# Patient Record
Sex: Male | Born: 1970 | Race: White | Hispanic: No | Marital: Single | Smoking: Never smoker
Health system: Southern US, Community
[De-identification: ages and names within clinical notes are randomized; demographics above are authoritative.]

## PROBLEM LIST (undated history)

## (undated) DIAGNOSIS — I10 Essential (primary) hypertension: Secondary | ICD-10-CM

## (undated) DIAGNOSIS — E119 Type 2 diabetes mellitus without complications: Secondary | ICD-10-CM

## (undated) DIAGNOSIS — R079 Chest pain, unspecified: Secondary | ICD-10-CM

## (undated) DIAGNOSIS — I509 Heart failure, unspecified: Secondary | ICD-10-CM

## (undated) DIAGNOSIS — E785 Hyperlipidemia, unspecified: Secondary | ICD-10-CM

## (undated) DIAGNOSIS — I214 Non-ST elevation (NSTEMI) myocardial infarction: Secondary | ICD-10-CM

## (undated) HISTORY — DX: Type 2 diabetes mellitus without complications: E11.9

## (undated) HISTORY — DX: Non-ST elevation (NSTEMI) myocardial infarction: I21.4

## (undated) HISTORY — DX: Morbid (severe) obesity due to excess calories: E66.01

## (undated) HISTORY — DX: Essential (primary) hypertension: I10

## (undated) HISTORY — DX: Chest pain, unspecified: R07.9

## (undated) HISTORY — DX: Heart failure, unspecified: I50.9

---

## 2015-11-13 DIAGNOSIS — E119 Type 2 diabetes mellitus without complications: Secondary | ICD-10-CM | POA: Diagnosis not present

## 2015-11-13 DIAGNOSIS — I10 Essential (primary) hypertension: Secondary | ICD-10-CM | POA: Diagnosis not present

## 2015-11-13 DIAGNOSIS — Z794 Long term (current) use of insulin: Secondary | ICD-10-CM | POA: Diagnosis not present

## 2015-11-13 DIAGNOSIS — E7800 Pure hypercholesterolemia, unspecified: Secondary | ICD-10-CM | POA: Diagnosis not present

## 2015-11-22 DIAGNOSIS — I10 Essential (primary) hypertension: Secondary | ICD-10-CM | POA: Diagnosis not present

## 2015-11-22 DIAGNOSIS — E119 Type 2 diabetes mellitus without complications: Secondary | ICD-10-CM | POA: Diagnosis not present

## 2015-11-22 DIAGNOSIS — E7800 Pure hypercholesterolemia, unspecified: Secondary | ICD-10-CM | POA: Diagnosis not present

## 2016-06-02 DIAGNOSIS — I10 Essential (primary) hypertension: Secondary | ICD-10-CM | POA: Diagnosis not present

## 2016-06-02 DIAGNOSIS — Z23 Encounter for immunization: Secondary | ICD-10-CM | POA: Diagnosis not present

## 2016-06-02 DIAGNOSIS — E119 Type 2 diabetes mellitus without complications: Secondary | ICD-10-CM | POA: Diagnosis not present

## 2016-06-02 DIAGNOSIS — E782 Mixed hyperlipidemia: Secondary | ICD-10-CM | POA: Diagnosis not present

## 2016-06-05 DIAGNOSIS — E13319 Other specified diabetes mellitus with unspecified diabetic retinopathy without macular edema: Secondary | ICD-10-CM | POA: Diagnosis not present

## 2016-07-01 DIAGNOSIS — H43811 Vitreous degeneration, right eye: Secondary | ICD-10-CM | POA: Diagnosis not present

## 2016-07-01 DIAGNOSIS — H3582 Retinal ischemia: Secondary | ICD-10-CM | POA: Diagnosis not present

## 2016-07-01 DIAGNOSIS — E113413 Type 2 diabetes mellitus with severe nonproliferative diabetic retinopathy with macular edema, bilateral: Secondary | ICD-10-CM | POA: Diagnosis not present

## 2016-07-22 DIAGNOSIS — E113412 Type 2 diabetes mellitus with severe nonproliferative diabetic retinopathy with macular edema, left eye: Secondary | ICD-10-CM | POA: Diagnosis not present

## 2016-07-31 DIAGNOSIS — J029 Acute pharyngitis, unspecified: Secondary | ICD-10-CM | POA: Diagnosis not present

## 2016-07-31 DIAGNOSIS — J111 Influenza due to unidentified influenza virus with other respiratory manifestations: Secondary | ICD-10-CM | POA: Diagnosis not present

## 2016-08-05 DIAGNOSIS — E113411 Type 2 diabetes mellitus with severe nonproliferative diabetic retinopathy with macular edema, right eye: Secondary | ICD-10-CM | POA: Diagnosis not present

## 2016-08-31 DIAGNOSIS — E119 Type 2 diabetes mellitus without complications: Secondary | ICD-10-CM | POA: Diagnosis not present

## 2016-08-31 DIAGNOSIS — I10 Essential (primary) hypertension: Secondary | ICD-10-CM | POA: Diagnosis not present

## 2016-08-31 DIAGNOSIS — E782 Mixed hyperlipidemia: Secondary | ICD-10-CM | POA: Diagnosis not present

## 2016-11-04 DIAGNOSIS — E113413 Type 2 diabetes mellitus with severe nonproliferative diabetic retinopathy with macular edema, bilateral: Secondary | ICD-10-CM | POA: Diagnosis not present

## 2016-11-04 DIAGNOSIS — H43811 Vitreous degeneration, right eye: Secondary | ICD-10-CM | POA: Diagnosis not present

## 2016-12-01 DIAGNOSIS — E1129 Type 2 diabetes mellitus with other diabetic kidney complication: Secondary | ICD-10-CM | POA: Diagnosis not present

## 2016-12-01 DIAGNOSIS — R809 Proteinuria, unspecified: Secondary | ICD-10-CM | POA: Diagnosis not present

## 2016-12-01 DIAGNOSIS — I10 Essential (primary) hypertension: Secondary | ICD-10-CM | POA: Diagnosis not present

## 2016-12-01 DIAGNOSIS — E782 Mixed hyperlipidemia: Secondary | ICD-10-CM | POA: Diagnosis not present

## 2016-12-10 DIAGNOSIS — E875 Hyperkalemia: Secondary | ICD-10-CM | POA: Diagnosis not present

## 2017-03-03 DIAGNOSIS — I10 Essential (primary) hypertension: Secondary | ICD-10-CM | POA: Diagnosis not present

## 2017-03-03 DIAGNOSIS — Z23 Encounter for immunization: Secondary | ICD-10-CM | POA: Diagnosis not present

## 2017-03-03 DIAGNOSIS — R809 Proteinuria, unspecified: Secondary | ICD-10-CM | POA: Diagnosis not present

## 2017-03-03 DIAGNOSIS — E1129 Type 2 diabetes mellitus with other diabetic kidney complication: Secondary | ICD-10-CM | POA: Diagnosis not present

## 2017-03-03 DIAGNOSIS — E782 Mixed hyperlipidemia: Secondary | ICD-10-CM | POA: Diagnosis not present

## 2017-03-09 DIAGNOSIS — H43811 Vitreous degeneration, right eye: Secondary | ICD-10-CM | POA: Diagnosis not present

## 2017-03-09 DIAGNOSIS — E113411 Type 2 diabetes mellitus with severe nonproliferative diabetic retinopathy with macular edema, right eye: Secondary | ICD-10-CM | POA: Diagnosis not present

## 2017-03-09 DIAGNOSIS — H3582 Retinal ischemia: Secondary | ICD-10-CM | POA: Diagnosis not present

## 2017-03-09 DIAGNOSIS — E113492 Type 2 diabetes mellitus with severe nonproliferative diabetic retinopathy without macular edema, left eye: Secondary | ICD-10-CM | POA: Diagnosis not present

## 2017-03-20 ENCOUNTER — Encounter (HOSPITAL_COMMUNITY): Payer: Self-pay

## 2017-03-20 ENCOUNTER — Emergency Department (HOSPITAL_COMMUNITY): Payer: BLUE CROSS/BLUE SHIELD

## 2017-03-20 ENCOUNTER — Inpatient Hospital Stay (HOSPITAL_COMMUNITY)
Admission: EM | Admit: 2017-03-20 | Discharge: 2017-03-24 | DRG: 246 | Disposition: A | Discharge status: Home / Self Care | Payer: BLUE CROSS/BLUE SHIELD | Attending: Internal Medicine | Admitting: Internal Medicine

## 2017-03-20 DIAGNOSIS — D649 Anemia, unspecified: Secondary | ICD-10-CM | POA: Diagnosis not present

## 2017-03-20 DIAGNOSIS — I509 Heart failure, unspecified: Secondary | ICD-10-CM | POA: Diagnosis not present

## 2017-03-20 DIAGNOSIS — E118 Type 2 diabetes mellitus with unspecified complications: Secondary | ICD-10-CM

## 2017-03-20 DIAGNOSIS — Z955 Presence of coronary angioplasty implant and graft: Secondary | ICD-10-CM | POA: Diagnosis not present

## 2017-03-20 DIAGNOSIS — I10 Essential (primary) hypertension: Secondary | ICD-10-CM | POA: Diagnosis not present

## 2017-03-20 DIAGNOSIS — J40 Bronchitis, not specified as acute or chronic: Secondary | ICD-10-CM | POA: Diagnosis not present

## 2017-03-20 DIAGNOSIS — E785 Hyperlipidemia, unspecified: Secondary | ICD-10-CM | POA: Diagnosis not present

## 2017-03-20 DIAGNOSIS — I214 Non-ST elevation (NSTEMI) myocardial infarction: Secondary | ICD-10-CM | POA: Diagnosis not present

## 2017-03-20 DIAGNOSIS — I2510 Atherosclerotic heart disease of native coronary artery without angina pectoris: Secondary | ICD-10-CM | POA: Diagnosis present

## 2017-03-20 DIAGNOSIS — R0602 Shortness of breath: Secondary | ICD-10-CM

## 2017-03-20 DIAGNOSIS — Z8659 Personal history of other mental and behavioral disorders: Secondary | ICD-10-CM | POA: Diagnosis not present

## 2017-03-20 DIAGNOSIS — E119 Type 2 diabetes mellitus without complications: Secondary | ICD-10-CM | POA: Diagnosis not present

## 2017-03-20 DIAGNOSIS — Z6836 Body mass index (BMI) 36.0-36.9, adult: Secondary | ICD-10-CM

## 2017-03-20 DIAGNOSIS — I25110 Atherosclerotic heart disease of native coronary artery with unstable angina pectoris: Secondary | ICD-10-CM | POA: Diagnosis not present

## 2017-03-20 DIAGNOSIS — J988 Other specified respiratory disorders: Secondary | ICD-10-CM | POA: Diagnosis not present

## 2017-03-20 DIAGNOSIS — Z87891 Personal history of nicotine dependence: Secondary | ICD-10-CM

## 2017-03-20 DIAGNOSIS — I5031 Acute diastolic (congestive) heart failure: Secondary | ICD-10-CM | POA: Diagnosis not present

## 2017-03-20 DIAGNOSIS — E7800 Pure hypercholesterolemia, unspecified: Secondary | ICD-10-CM | POA: Diagnosis not present

## 2017-03-20 DIAGNOSIS — I110 Hypertensive heart disease with heart failure: Secondary | ICD-10-CM | POA: Diagnosis not present

## 2017-03-20 DIAGNOSIS — Z794 Long term (current) use of insulin: Secondary | ICD-10-CM | POA: Diagnosis not present

## 2017-03-20 DIAGNOSIS — R079 Chest pain, unspecified: Secondary | ICD-10-CM | POA: Diagnosis not present

## 2017-03-20 DIAGNOSIS — R938 Abnormal findings on diagnostic imaging of other specified body structures: Secondary | ICD-10-CM | POA: Diagnosis not present

## 2017-03-20 HISTORY — DX: Hyperlipidemia, unspecified: E78.5

## 2017-03-20 HISTORY — DX: Type 2 diabetes mellitus without complications: E11.9

## 2017-03-20 HISTORY — DX: Chest pain, unspecified: R07.9

## 2017-03-20 HISTORY — DX: Essential (primary) hypertension: I10

## 2017-03-20 LAB — GLUCOSE, CAPILLARY: GLUCOSE-CAPILLARY: 125 mg/dL — AB (ref 65–99)

## 2017-03-20 LAB — I-STAT TROPONIN, ED: Troponin i, poc: 0.16 ng/mL (ref 0.00–0.08)

## 2017-03-20 LAB — HEPARIN LEVEL (UNFRACTIONATED)

## 2017-03-20 LAB — TROPONIN I: Troponin I: 0.34 ng/mL (ref <0.03)

## 2017-03-20 LAB — BASIC METABOLIC PANEL
Anion gap: 7 (ref 5–15)
BUN: 16 mg/dL (ref 6–20)
CHLORIDE: 106 mmol/L (ref 101–111)
CO2: 24 mmol/L (ref 22–32)
CREATININE: 1.03 mg/dL (ref 0.61–1.24)
Calcium: 9.1 mg/dL (ref 8.9–10.3)
GFR calc Af Amer: >60 mL/min (ref >60)
GFR calc non Af Amer: >60 mL/min (ref >60)
GLUCOSE: 141 mg/dL — AB (ref 65–99)
Potassium: 3.8 mmol/L (ref 3.5–5.1)
SODIUM: 137 mmol/L (ref 135–145)

## 2017-03-20 LAB — CBC
HEMATOCRIT: 34.9 % — AB (ref 39.0–52.0)
Hemoglobin: 11.6 g/dL — ABNORMAL LOW (ref 13.0–17.0)
MCH: 30.7 pg (ref 26.0–34.0)
MCHC: 33.2 g/dL (ref 30.0–36.0)
MCV: 92.3 fL (ref 78.0–100.0)
PLATELETS: 304 10*3/uL (ref 150–400)
RBC: 3.78 MIL/uL — ABNORMAL LOW (ref 4.22–5.81)
RDW: 13.3 % (ref 11.5–15.5)
WBC: 14.4 10*3/uL — AB (ref 4.0–10.5)

## 2017-03-20 LAB — BRAIN NATRIURETIC PEPTIDE: B Natriuretic Peptide: 469.2 pg/mL — ABNORMAL HIGH (ref 0.0–100.0)

## 2017-03-20 MED ORDER — FUROSEMIDE 10 MG/ML IJ SOLN
40.0000 mg | Freq: Once | INTRAMUSCULAR | Status: AC
  Administered 2017-03-20: 40 mg via INTRAVENOUS
  Filled 2017-03-20: qty 4

## 2017-03-20 MED ORDER — ASPIRIN 81 MG PO CHEW: 324.0000 mg | CHEWABLE_TABLET | ORAL | Status: DC

## 2017-03-20 MED ORDER — LISINOPRIL 20 MG PO TABS
20.0000 mg | ORAL_TABLET | Freq: Every evening | ORAL | Status: DC
  Administered 2017-03-20 – 2017-03-22 (×3): 20 mg via ORAL
  Filled 2017-03-20 (×3): qty 1

## 2017-03-20 MED ORDER — HEPARIN SODIUM (PORCINE) 5000 UNIT/ML IJ SOLN: 60.0000 [IU]/kg | Freq: Once | INTRAMUSCULAR | Status: DC

## 2017-03-20 MED ORDER — HEPARIN BOLUS VIA INFUSION
2000.0000 [IU] | Freq: Once | INTRAVENOUS | Status: AC
  Administered 2017-03-20: 2000 [IU] via INTRAVENOUS
  Filled 2017-03-20: qty 2000

## 2017-03-20 MED ORDER — NITROGLYCERIN 0.4 MG SL SUBL: 0.4000 mg | SUBLINGUAL_TABLET | SUBLINGUAL | Status: DC | PRN

## 2017-03-20 MED ORDER — INSULIN DEGLUDEC 100 UNIT/ML ~~LOC~~ SOPN: 40.0000 [IU] | PEN_INJECTOR | Freq: Every evening | SUBCUTANEOUS | Status: DC

## 2017-03-20 MED ORDER — LISINOPRIL-HYDROCHLOROTHIAZIDE 20-12.5 MG PO TABS: 1.0000 | ORAL_TABLET | Freq: Every evening | ORAL | Status: DC

## 2017-03-20 MED ORDER — INSULIN GLARGINE 100 UNIT/ML ~~LOC~~ SOLN
40.0000 [IU] | Freq: Every day | SUBCUTANEOUS | Status: DC
  Administered 2017-03-20 – 2017-03-23 (×3): 40 [IU] via SUBCUTANEOUS
  Filled 2017-03-20 (×5): qty 0.4

## 2017-03-20 MED ORDER — ASPIRIN EC 81 MG PO TBEC
81.0000 mg | DELAYED_RELEASE_TABLET | Freq: Every day | ORAL | Status: DC
  Administered 2017-03-21: 81 mg via ORAL
  Filled 2017-03-20: qty 1

## 2017-03-20 MED ORDER — FENOFIBRATE 160 MG PO TABS
160.0000 mg | ORAL_TABLET | Freq: Every day | ORAL | Status: DC
  Administered 2017-03-20: 160 mg via ORAL
  Filled 2017-03-20 (×2): qty 1

## 2017-03-20 MED ORDER — ASPIRIN 81 MG PO CHEW
324.0000 mg | CHEWABLE_TABLET | Freq: Once | ORAL | Status: AC
  Administered 2017-03-20: 324 mg via ORAL
  Filled 2017-03-20: qty 4

## 2017-03-20 MED ORDER — HEPARIN (PORCINE) IN NACL 100-0.45 UNIT/ML-% IJ SOLN: 12.0000 [IU]/kg/h | INTRAMUSCULAR | Status: DC

## 2017-03-20 MED ORDER — INSULIN ASPART 100 UNIT/ML ~~LOC~~ SOLN: 0.0000 [IU] | Freq: Every day | SUBCUTANEOUS | Status: DC

## 2017-03-20 MED ORDER — ASPIRIN 300 MG RE SUPP: 300.0000 mg | RECTAL | Status: DC

## 2017-03-20 MED ORDER — METFORMIN HCL 500 MG PO TABS
1000.0000 mg | ORAL_TABLET | Freq: Two times a day (BID) | ORAL | Status: DC
  Administered 2017-03-21 (×2): 1000 mg via ORAL
  Filled 2017-03-20 (×2): qty 2

## 2017-03-20 MED ORDER — HEPARIN (PORCINE) IN NACL 100-0.45 UNIT/ML-% IJ SOLN
2000.0000 [IU]/h | INTRAMUSCULAR | Status: DC
  Administered 2017-03-20: 1300 [IU]/h via INTRAVENOUS
  Administered 2017-03-21: 1650 [IU]/h via INTRAVENOUS
  Filled 2017-03-20 (×2): qty 250

## 2017-03-20 MED ORDER — ACETAMINOPHEN 325 MG PO TABS
650.0000 mg | ORAL_TABLET | ORAL | Status: DC | PRN
  Administered 2017-03-20: 650 mg via ORAL
  Filled 2017-03-20: qty 2

## 2017-03-20 MED ORDER — POTASSIUM CHLORIDE CRYS ER 20 MEQ PO TBCR
20.0000 meq | EXTENDED_RELEASE_TABLET | Freq: Once | ORAL | Status: AC
  Administered 2017-03-20: 20 meq via ORAL
  Filled 2017-03-20: qty 1

## 2017-03-20 MED ORDER — FUROSEMIDE 10 MG/ML IJ SOLN
40.0000 mg | Freq: Once | INTRAMUSCULAR | Status: AC
Start: 2017-03-20 — End: 2017-03-20
  Administered 2017-03-20: 40 mg via INTRAVENOUS
  Filled 2017-03-20: qty 4

## 2017-03-20 MED ORDER — INSULIN ASPART 100 UNIT/ML ~~LOC~~ SOLN
0.0000 [IU] | Freq: Three times a day (TID) | SUBCUTANEOUS | Status: DC
  Administered 2017-03-21 – 2017-03-22 (×2): 2 [IU] via SUBCUTANEOUS

## 2017-03-20 MED ORDER — ATORVASTATIN CALCIUM 10 MG PO TABS
10.0000 mg | ORAL_TABLET | Freq: Every evening | ORAL | Status: DC
  Administered 2017-03-20 – 2017-03-22 (×3): 10 mg via ORAL
  Filled 2017-03-20 (×3): qty 1

## 2017-03-20 MED ORDER — AMOXICILLIN-POT CLAVULANATE 875-125 MG PO TABS
1.0000 | ORAL_TABLET | Freq: Two times a day (BID) | ORAL | Status: DC
  Administered 2017-03-20 – 2017-03-23 (×6): 1 via ORAL
  Filled 2017-03-20 (×7): qty 1

## 2017-03-20 MED ORDER — HYDROCHLOROTHIAZIDE 12.5 MG PO CAPS
12.5000 mg | ORAL_CAPSULE | Freq: Every evening | ORAL | Status: DC
  Administered 2017-03-20 – 2017-03-21 (×2): 12.5 mg via ORAL
  Filled 2017-03-20 (×3): qty 1

## 2017-03-20 MED ORDER — ONDANSETRON HCL 4 MG/2ML IJ SOLN: 4.0000 mg | Freq: Four times a day (QID) | INTRAMUSCULAR | Status: DC | PRN

## 2017-03-20 MED ORDER — METOPROLOL TARTRATE 25 MG PO TABS
25.0000 mg | ORAL_TABLET | Freq: Once | ORAL | Status: AC
  Administered 2017-03-20: 25 mg via ORAL
  Filled 2017-03-20: qty 1

## 2017-03-20 MED ORDER — HEPARIN BOLUS VIA INFUSION
4000.0000 [IU] | Freq: Once | INTRAVENOUS | Status: AC
  Administered 2017-03-20: 4000 [IU] via INTRAVENOUS
  Filled 2017-03-20: qty 4000

## 2017-03-20 NOTE — Progress Notes (Signed)
ANTICOAGULATION CONSULT NOTE   Pharmacy Consult for Heparin Indication: chest pain/ACS  No Known Allergies  Patient Measurements: TBW 115 kg per RN Heparin Dosing Weight  Vital Signs: Temp: 98.4 F (36.9 C) (09/22 2029) Temp Source: Oral (09/22 2029) BP: 130/72 (09/22 2029) Pulse Rate: 77 (09/22 2029)  Assessment: 46 yo M presents on 9/22 with SOB and possible CHF. I stat troponin elevated. Pharmacy consulted to start heparin. Hgb 11.6, plts wnl.  Initial heparin level is subtherapeutic < 0.10. No issues with infusion or sxs of bleeding.   Goal of Therapy:  Heparin level 0.3-0.7 units/ml Monitor platelets by anticoagulation protocol: Yes   Plan:  1. Heparin 2,000 unit bolus 2. Increase heparin gtt to 1650 units/hr 3. Next heparin level with am labs    Pollyann Samples, PharmD, BCPS 03/20/2017, 9:06 PM

## 2017-03-20 NOTE — Progress Notes (Signed)
ANTICOAGULATION CONSULT NOTE - Initial Consult  Pharmacy Consult for Heparin Indication: chest pain/ACS  No Known Allergies  Patient Measurements: TBW 115 kg per RN Heparin Dosing Weight:   Vital Signs: Temp: 98.3 F (36.8 C) (09/22 1113) Temp Source: Oral (09/22 1113) BP: 115/68 (09/22 1230) Pulse Rate: 79 (09/22 1230)  Assessment: 46 yo M presents on 9/22 with SOB and possible CHF. I stat troponin elevated. Pharmacy consulted to start heparin. Hgb 11.6, plts wnl.  Goal of Therapy:  Heparin level 0.3-0.7 units/ml Monitor platelets by anticoagulation protocol: Yes   Plan:  Give heparin 4,000 unit bolus Start heparin gtt at 1,300 units/hr Check heparin level in 6 hrs Monitor daily heparin level, CBC, s/s of bleed   Enzo Bi, PharmD, [facility] Clinical Pharmacist Pager [phone] 03/20/2017 12:49 PM

## 2017-03-20 NOTE — Progress Notes (Signed)
No new orders. Continue heparin. Trend troponin per MD.  Continue to monitor.  Bettey Mare, RN

## 2017-03-20 NOTE — ED Provider Notes (Addendum)
[facility]-EMERGENCY DEPT Provider Note   CSN: [id number] Arrival date & time: 03/20/17  1104     History   Chief Complaint Chief Complaint  Patient presents with  . Shortness of Breath    HPI George Moreno is a 46 y.o. male.  HPI This is a 46 year old man with a history of type 2 diabetes, hypertension, hypercholesterolemia who has had dyspnea for the past 48 hours. He states that he had a tooth removed on Wednesday and was placed on antibiotics. We will cut Thursday he felt somewhat restless. This has continued. He thought that he was type of respiratory infection. Says cough is nonproductive. He has not noticed any chest pain with the exception of one short episode of right anterior chest discomfort that was in a small area. Not currently occurring. He was seen at the [facility] walk-in clinic today. There he was noted to be dyspneic and chest x-Darol Cush revealed a question of early congestive heart failure. He was diagnosed with early CHF, acute respiratory infection, diabetes and anemia. He was given ceftriaxone 1 g IM. His white blood cell count there was noted to be elevated at 15,100 hemoglobin 11.3 x-Adela Esteban report from their reads changes of early CHF but we are unable to visualize the actual x-Kendrew Paci here. Labs drawn here at triage revealed elevated troponin Dental extraction on Wednesday and states there has been no bleeding or problems with the site which is in the right lower jaw molar. He does feel the stitches in place. Past Medical History:  Diagnosis Date  . Diabetes mellitus without complication (HCC)   . Hypertension     There are no active problems to display for this patient.   History reviewed. No pertinent surgical history.     Home Medications    Prior to Admission medications   Not on File    Family History History reviewed. No pertinent family history.  Social History Social History  Substance Use Topics  . Smoking status: Never Smoker  . Smokeless tobacco: Never  Used  . Alcohol use Yes     Comment: rare     Allergies   Patient has no known allergies.   Review of Systems Review of Systems  Constitutional: Positive for appetite change and fatigue.  HENT: Negative.   Eyes: Negative.   Respiratory: Positive for cough.   Cardiovascular: Positive for chest pain.  Gastrointestinal: Positive for abdominal distention.  Genitourinary: Negative.   Musculoskeletal: Negative.   Skin: Negative.   Allergic/Immunologic: Negative.   Neurological: Negative.   Hematological: Negative.   Psychiatric/Behavioral: Negative.   All other systems reviewed and are negative.    Physical Exam Updated Vital Signs BP 139/83 (BP Location: Left Arm)   Pulse 86   Temp 98.3 F (36.8 C) (Oral)   Resp 18   SpO2 96%   Physical Exam  Constitutional: He is oriented to person, place, and time. He appears well-developed.  HENT:  Head: Normocephalic and atraumatic.  Right Ear: External ear normal.  Left Ear: External ear normal.  Nose: Nose normal.  Healing site of dental extraction right lower jaw with sutures in place  Eyes: EOM are normal.  Neck: Normal range of motion. Neck supple. No tracheal deviation present.  Cardiovascular: Normal rate and regular rhythm.   Pulmonary/Chest: Effort normal. No respiratory distress. He has no wheezes. He has no rales.  Few basilar crackles bilaterally  Abdominal: Soft. Bowel sounds are normal.  Musculoskeletal: Normal range of motion. He exhibits no edema.  Neurological: He  is alert and oriented to person, place, and time.  Skin: Skin is warm and dry.  Psychiatric: He has a normal mood and affect. His behavior is normal.  Nursing note and vitals reviewed.    ED Treatments / Results  Labs (all labs ordered are listed, but only abnormal results are displayed) Labs Reviewed  I-STAT TROPONIN, ED - Abnormal; Notable for the following:       Result Value   Troponin i, poc 0.16 (*)    All other components within  normal limits  BASIC METABOLIC PANEL  CBC    EKG  EKG Interpretation  Date/Time:  Saturday March 20 2017 11:11:04 EDT Ventricular Rate:  86 PR Interval:  134 QRS Duration: 100 QT Interval:  362 QTC Calculation: 433 R Axis:   87 Text Interpretation:  Normal sinus rhythm Septal infarct , age undetermined ST & T wave abnormality, consider inferior ischemia Abnormal ECG Confirmed by Margarita Grizzle [phone]) on 03/20/2017 12:07:37 PM       Radiology No results found.  Procedures .Critical Care Performed by: Margarita Grizzle Authorized by: Margarita Grizzle   Critical care provider statement:    Critical care time (minutes):  60   Critical care start time:  03/20/2017 11:15 AM   Critical care end time:  03/20/2017 2:17 PM   Critical care time was exclusive of:  Separately billable procedures and treating other patients   Critical care was necessary to treat or prevent imminent or life-threatening deterioration of the following conditions:  Cardiac failure   Critical care was time spent personally by me on the following activities:  Development of treatment plan with patient or surrogate, discussions with consultants, ordering and performing treatments and interventions, ordering and review of laboratory studies, ordering and review of radiographic studies, pulse oximetry, re-evaluation of patient's condition, review of old charts, examination of patient and evaluation of patient's response to treatment (Review of record from primary care -patient was treated in [facility] clinic prior to being sent to ED)     (including critical care time)  Medications Ordered in ED Medications - No data to display   Initial Impression / Assessment and Plan / ED Course  I have reviewed the triage vital signs and the nursing notes.  Pertinent labs & imaging results that were available during my care of the patient were reviewed by me and considered in my medical decision making (see chart for details).    patient treated here for CHF and an STEMI with aspirin, heparin, and Lasix. He is hemodynamically stable. I discussed his care with Dr. Dietrich Pates, on call for cardiology. She will see and evaluate the patient for admission.  C  Final Clinical Impressions(s) / ED Diagnoses   Final diagnoses:  NSTEMI (non-ST elevated myocardial infarction) (HCC)  Acute congestive heart failure, unspecified heart failure type [facility])    New Prescriptions New Prescriptions   No medications on file     Margarita Grizzle, MD 03/20/17 1417    Margarita Grizzle, MD 03/20/17 1430

## 2017-03-20 NOTE — Progress Notes (Signed)
Patient troponin level 0.34. Patient denies chest pain 0/10.  Cardilogy MD Means notified.  Awaiting on possible orders.  Will continue to monitor.  Tremeka Helbling, RN

## 2017-03-20 NOTE — ED Triage Notes (Addendum)
[facility] for follow up on SOB and possible CHF on his XR. He reports shortness of breath since Sunday. Resp e/u. Skin warm and dry. Hx of chronic bronchitis. Lung sounds clear.

## 2017-03-20 NOTE — H&P (Addendum)
Primary Physician: Primary Cardiologist:  New    Asked to see by Dr Rosalia Hammers    Pt presents to ED for chest pressure  And SOB   HPI: Pt is a 46 yo with DM Type II, HTN, HL  Complains of SOB for 48 hours    Pt says that he has beeen feeling good until this week  Monday he was very busy lifting things  Up and down stairs without a problem Wed he had a tooth extracted  Given ABX   Went home Thurs and Fri had severe feverish feeling and chills  Did not take temp Coughing nonroductive Rigoring  On Thursday develped R sided CP  Not pleuritic or porsiontal  WOnders if he had pulled a muscle  Went to [facility] walk in clinic this AM   SOB  CXR with early CHF   Given ceftriaxon IM  WBC 15000.  Sent to ED  Currently mild R parasternal chest discomfort  Breathgin imporoved  Feels goo  Pt dx with DM 2 years ago  A1C was 14 at time  Just prior he was diaagnosed with HTN and HL         Past Medical History:  Diagnosis Date  . Diabetes mellitus without complication (HCC)   . Hypertension   hyperlipidemia     (Not in a hospital admission)   . heparin  4,000 Units Intravenous Once    Infusions: . heparin      No Known Allergies  Social History   Social History  . Marital status: Single    Spouse name: N/A  . Number of children: N/A  . Years of education: N/A   Occupational History  . Not on file.   Social History Main Topics  . Smoking status: Never Smoker  . Smokeless tobacco: Never Used  . Alcohol use Yes     Comment: rare  . Drug use: Unknown  . Sexual activity: Not on file   Other Topics Concern  . Not on file   Social History Narrative  . No narrative on file   FHX  Unknown  Pt adopted   History reviewed. No pertinent family history.  REVIEW OF SYSTEMS:  All systems reviewed  Negative to the above problem except as noted above.    PHYSICAL EXAM: Vitals:   03/20/17 1230 03/20/17 1300  BP: 115/68 114/68  Pulse: 79 78  Resp: 17 16  Temp:    SpO2: 93%  96%     Intake/Output Summary (Last 24 hours) at 03/20/17 1340 Last data filed at 03/20/17 1307  Gross per 24 hour  Intake                0 ml  Output              275 ml  Net             -275 ml    General:  Well appearing. No respiratory difficulty HEENT: normal Neck: supple. no JVD. Carotids 2+ bilat; no bruits. No lymphadenopathy or thryomegaly appreciated. Cor: PMI nondisplaced. Regular rate & rhythm. No rubs, gallops or murmurs. Lungs: Rales at bases   Abdomen: soft, nontender, nondistended.  Mild RUQ tenderness  . No bruits or masses. Good bowel sounds. Extremities: no cyanosis, clubbing, rash, edema Neuro: alert & oriented x 3, cranial nerves grossly intact. moves all 4 extremities w/o difficulty. Affect pleasant.  ECG:  SR 86 bpm  ST depression inferior and laterally  Results for orders placed or performed during the hospital encounter of 03/20/17 (from the past 24 hour(s))  Basic metabolic panel     Status: Abnormal   Collection Time: 03/20/17 11:15 AM  Result Value Ref Range   Sodium 137 135 - 145 mmol/L   Potassium 3.8 3.5 - 5.1 mmol/L   Chloride 106 101 - 111 mmol/L   CO2 24 22 - 32 mmol/L   Glucose, Bld 141 (H) 65 - 99 mg/dL   BUN 16 6 - 20 mg/dL   Creatinine, Ser 1.61 0.61 - 1.24 mg/dL   Calcium 9.1 8.9 - 09.6 mg/dL   GFR calc non Af Amer >60 >60 mL/min   GFR calc Af Amer >60 >60 mL/min   Anion gap 7 5 - 15  CBC     Status: Abnormal   Collection Time: 03/20/17 11:15 AM  Result Value Ref Range   WBC 14.4 (H) 4.0 - 10.5 K/uL   RBC 3.78 (L) 4.22 - 5.81 MIL/uL   Hemoglobin 11.6 (L) 13.0 - 17.0 g/dL   HCT 04.5 (L) 40.9 - 81.1 %   MCV 92.3 78.0 - 100.0 fL   MCH 30.7 26.0 - 34.0 pg   MCHC 33.2 30.0 - 36.0 g/dL   RDW 91.4 78.2 - 95.6 %   Platelets 304 150 - 400 K/uL  I-stat troponin, ED     Status: Abnormal   Collection Time: 03/20/17 11:22 AM  Result Value Ref Range   Troponin i, poc 0.16 (HH) 0.00 - 0.08 ng/mL   Comment NOTIFIED PHYSICIAN    Comment 3           Brain natriuretic peptide     Status: Abnormal   Collection Time: 03/20/17 12:46 PM  Result Value Ref Range   B Natriuretic Peptide 469.2 (H) 0.0 - 100.0 pg/mL   Dg Chest Portable 1 View  Result Date: 03/20/2017 CLINICAL DATA:  Shortness of breath and possible CHF. EXAM: PORTABLE CHEST 1 VIEW COMPARISON:  03/20/2017 FINDINGS: AP portable view of the chest was obtained. Slightly low lung volumes. Again noted are prominent central vascular structures. Prominent lung markings at the right lung base. Heart size is within normal limits. Trachea is midline. Negative for a pneumothorax. IMPRESSION: Prominent central vascular structures, particularly in the right lower chest. Findings may be related to low lung volumes and vascular congestion. Electronically Signed   By: Richarda Overlie M.D.   On: 03/20/2017 12:31     ASSESSMENT:   Pt is a 46 yo with no known cardiac history  Presents with SOB and R sided CP for the past few days   Also Fever/chills    On exam:  Lungs with mild rales Labs signif for WBG 14  BNP 469  Trop 0.16 CXR with vascular congestion EKG with diffuse ST depression  Inferolaterally  No old EKG to compare   Pt presentw with volume overload on exam (mild) and Hx of fevers /chills  On ABX now. What cause current presentation is confusiong  Pt says asymtpmatic earlier in week  Timing for viral cardiomyopathy is off.  Does not appear septic     Plan Admit to telemetry cycle troponin  On IV heparin now  If next trop minimally increased then d/c Continue diuresis with IV lasix  Give additional dose of lasix this PM  Strict I/O    Echo to eval LVEF     2.  HTN  Pt is on prinizide at home  Continue  Follow  3  HL  On statin  Check lipids in AM  4  DM  A1c 2 wks ago reported 6.3  Continue home meds  Check CS qac

## 2017-03-21 ENCOUNTER — Inpatient Hospital Stay (HOSPITAL_COMMUNITY): Payer: BLUE CROSS/BLUE SHIELD

## 2017-03-21 LAB — GLUCOSE, CAPILLARY
GLUCOSE-CAPILLARY: 93 mg/dL (ref 65–99)
GLUCOSE-CAPILLARY: 87 mg/dL (ref 65–99)
Glucose-Capillary: 122 mg/dL — ABNORMAL HIGH (ref 65–99)

## 2017-03-21 LAB — LIPID PANEL
CHOLESTEROL: 115 mg/dL (ref 0–200)
HDL: 28 mg/dL — ABNORMAL LOW (ref >40)
LDL Cholesterol: 62 mg/dL (ref 0–99)
TRIGLYCERIDES: 125 mg/dL (ref <150)
Total CHOL/HDL Ratio: 4.1 RATIO
VLDL: 25 mg/dL (ref 0–40)

## 2017-03-21 LAB — CBC WITH DIFFERENTIAL/PLATELET
BASOS PCT: 0 %
EOS PCT: 3 %
HCT: 34.8 % — ABNORMAL LOW (ref 39.0–52.0)
HEMOGLOBIN: 11.9 g/dL — AB (ref 13.0–17.0)
Lymphocytes Relative: 26 %
MCH: 31.5 pg (ref 26.0–34.0)
MCHC: 34.2 g/dL (ref 30.0–36.0)
MCV: 92.1 fL (ref 78.0–100.0)
MONOS PCT: 10 %
NEUTROS PCT: 61 %
PLATELETS: 285 10*3/uL (ref 150–400)
RBC: 3.78 MIL/uL — AB (ref 4.22–5.81)
RDW: 13.1 % (ref 11.5–15.5)
WBC: 10.6 10*3/uL — AB (ref 4.0–10.5)

## 2017-03-21 LAB — HEPARIN LEVEL (UNFRACTIONATED)

## 2017-03-21 LAB — BASIC METABOLIC PANEL
Anion gap: 8 (ref 5–15)
BUN: 18 mg/dL (ref 6–20)
CHLORIDE: 102 mmol/L (ref 101–111)
CO2: 27 mmol/L (ref 22–32)
CREATININE: 1.07 mg/dL (ref 0.61–1.24)
Calcium: 9.3 mg/dL (ref 8.9–10.3)
GFR calc Af Amer: >60 mL/min (ref >60)
GFR calc non Af Amer: >60 mL/min (ref >60)
Glucose, Bld: 110 mg/dL — ABNORMAL HIGH (ref 65–99)
POTASSIUM: 3.7 mmol/L (ref 3.5–5.1)
SODIUM: 137 mmol/L (ref 135–145)

## 2017-03-21 LAB — ECHOCARDIOGRAM COMPLETE
HEIGHTINCHES: 67 in
Weight: 3860.8 oz

## 2017-03-21 LAB — MAGNESIUM: Magnesium: 1.8 mg/dL (ref 1.7–2.4)

## 2017-03-21 LAB — TROPONIN I
TROPONIN I: 0.27 ng/mL — AB (ref <0.03)
TROPONIN I: 0.32 ng/mL — AB (ref <0.03)

## 2017-03-21 LAB — HEMOGLOBIN A1C
Hgb A1c MFr Bld: 6.2 % — ABNORMAL HIGH (ref 4.8–5.6)
Mean Plasma Glucose: 131.24 mg/dL

## 2017-03-21 LAB — HIV ANTIBODY (ROUTINE TESTING W REFLEX): HIV SCREEN 4TH GENERATION: NONREACTIVE

## 2017-03-21 MED ORDER — FENOFIBRATE 160 MG PO TABS
160.0000 mg | ORAL_TABLET | Freq: Every day | ORAL | Status: DC
  Administered 2017-03-21 – 2017-03-23 (×3): 160 mg via ORAL
  Filled 2017-03-21 (×3): qty 1

## 2017-03-21 MED ORDER — HEPARIN BOLUS VIA INFUSION
3000.0000 [IU] | Freq: Once | INTRAVENOUS | Status: AC
  Administered 2017-03-21: 3000 [IU] via INTRAVENOUS
  Filled 2017-03-21: qty 3000

## 2017-03-21 MED ORDER — PERFLUTREN LIPID MICROSPHERE
1.0000 mL | INTRAVENOUS | Status: AC | PRN
  Administered 2017-03-21: 2 mL via INTRAVENOUS
  Filled 2017-03-21: qty 10

## 2017-03-21 NOTE — Progress Notes (Signed)
ANTICOAGULATION CONSULT NOTE - Follow Up Consult  Pharmacy Consult for heparin Indication: chest pain/ACS  Labs:  Recent Labs  03/20/17 1115 03/20/17 1806 03/20/17 2006 03/21/17 0000 03/21/17 0521  HGB 11.6*  --   --   --  11.9*  HCT 34.9*  --   --   --  34.8*  PLT 304  --   --   --  285  HEPARINUNFRC  --   --  <0.10*  --  <0.10*  CREATININE 1.03  --   --   --  1.07  TROPONINI  --  0.34*  --  0.32* 0.27*    Assessment: 46yo male remains undetectable on heparin despite rate increase, no gtt issues reported overnight.  Goal of Therapy:  Heparin level 0.3-0.7 units/ml   Plan:  Will rebolus with heparin 3000 units and increase gtt by 3-4 units/kg/hr to 2000 units/hr and check level in 6hr.  Vernard Gambles, PharmD, BCPS  03/21/2017,7:21 AM

## 2017-03-21 NOTE — Progress Notes (Signed)
Patient  Has blood sugar of 87 tonight with 40 of lantus order. Patient is npo at midnight. Attending notified. lantus will be held.

## 2017-03-21 NOTE — Progress Notes (Signed)
Reviewed echo  LVEF normal with basal inferior hypokinesis   I have discussed with pt  Concerning with this his histroy of DM  and presentation with elevated trop and CHF for CAD  Would evaluate  Discussed CT vs cath  Pt has demanding physical job  Decide to proceed with LHC in am   Risks and benefits described  Pt understands and agrees to proceed.    Dietrich Pates

## 2017-03-21 NOTE — Progress Notes (Signed)
Progress Note  Patient Name: George Moreno Date of Encounter: 03/21/2017  Primary Cardiologist: New  Subjective   No CP  Breatihng is OK    Inpatient Medications    Scheduled Meds: . amoxicillin-clavulanate  1 tablet Oral BID  . aspirin EC  81 mg Oral Daily  . atorvastatin  10 mg Oral QPM  . fenofibrate  160 mg Oral Daily  . heparin  3,000 Units Intravenous Once  . lisinopril  20 mg Oral QPM   And  . hydrochlorothiazide  12.5 mg Oral QPM  . insulin aspart  0-15 Units Subcutaneous TID WC  . insulin aspart  0-5 Units Subcutaneous QHS  . insulin glargine  40 Units Subcutaneous QHS  . metFORMIN  1,000 mg Oral BID WC   Continuous Infusions: . heparin 1,650 Units/hr (03/21/17 0155)   PRN Meds: acetaminophen, nitroGLYCERIN, ondansetron (ZOFRAN) IV   Vital Signs    Vitals:   03/20/17 2029 03/21/17 0016 03/21/17 0630 03/21/17 0727  BP: 130/72 130/70 98/62 136/72  Pulse: 77 80 94 68  Resp: Temp: 98.4 F (36.9 C) 98.5 F (36.9 C) 98.9 F (37.2 C) 97.7 F (36.5 C)  TempSrc: Oral Oral Oral Oral  SpO2: 97% 96% 94% 96%  Weight:   241 lb 4.8 oz (109.5 kg)   Height:        Intake/Output Summary (Last 24 hours) at 03/21/17 0754 Last data filed at 03/21/17 1610  Gross per 24 hour  Intake           491.54 ml  Output             2650 ml  Net         -2158.46 ml   Filed Weights   03/20/17 1316 03/20/17 1757 03/21/17 0630  Weight: 255 lb (115.7 kg) 243 lb 1.6 oz (110.3 kg) 241 lb 4.8 oz (109.5 kg)    Telemetry    SR   - Personally Reviewed  ECG      Physical Exam   GEN: No acute distress.   Neck: No JVD Cardiac: RRR, no murmurs, rubs, or gallops.  Respiratory: Clear to auscultation bilaterally. GI: Soft, nontender, non-distended  MS: No edema; No deformity. Neuro:  Nonfocal  Psych: Normal affect   Labs    Chemistry Recent Labs Lab 03/20/17 1115 03/21/17 0521  NA 137 137  K 3.8 3.7  CL 106 102  CO2 24 27  GLUCOSE 141* 110*  BUN 16  18  CREATININE 1.03 1.07  CALCIUM 9.1 9.3  GFRNONAA >60 >60  GFRAA >60 >60  ANIONGAP 7 8     Hematology Recent Labs Lab 03/20/17 1115 03/21/17 0521  WBC 14.4* 10.6*  RBC 3.78* 3.78*  HGB 11.6* 11.9*  HCT 34.9* 34.8*  MCV 92.3 92.1  MCH 30.7 31.5  MCHC 33.2 34.2  RDW 13.3 13.1  PLT 304 285    Cardiac Enzymes Recent Labs Lab 03/20/17 1806 03/21/17 0000 03/21/17 0521  TROPONINI 0.34* 0.32* 0.27*    Recent Labs Lab 03/20/17 1122  TROPIPOC 0.16*     BNP Recent Labs Lab 03/20/17 1246  BNP 469.2*     DDimer No results for input(s): DDIMER in the last 168 hours.   Radiology    Dg Chest Portable 1 View  Result Date: 03/20/2017 CLINICAL DATA:  Shortness of breath and possible CHF. EXAM: PORTABLE CHEST 1 VIEW COMPARISON:  03/20/2017 FINDINGS: AP portable view of the chest was obtained. Slightly low lung  volumes. Again noted are prominent central vascular structures. Prominent lung markings at the right lung base. Heart size is within normal limits. Trachea is midline. Negative for a pneumothorax. IMPRESSION: Prominent central vascular structures, particularly in the right lower chest. Findings may be related to low lung volumes and vascular congestion. Electronically Signed   By: Richarda Overlie M.D.   On: 03/20/2017 12:31    Cardiac Studies   Echo pending   Patient Profile     46 y.o. male histroy of DM , HTN, HL  Presents with several day history of fever, chills, SOB    Assessment & Plan    1  Dypsnea  IMproved   Pt says he can breathe OK  Sl bmp in troponin  Echo pending  Not clar what caused  ? Acute infection   Feelign good now.   D/C heparin    2  Fever  Afebrile    3  HTN  BP is OK    4  DM  A1C 6.2    5  HL  HDL low  LDL 62  Keep on meds    For questions or updates, please contact [facility] Please consult [email or web address] for contact info under Cardiology/STEMI.      Signed, Dietrich Pates, MD  03/21/2017, 7:54 AM

## 2017-03-22 ENCOUNTER — Encounter (HOSPITAL_COMMUNITY)
Admission: EM | Disposition: A | Discharge status: Home / Self Care | Payer: Self-pay | Source: Home / Self Care | Attending: Internal Medicine

## 2017-03-22 ENCOUNTER — Observation Stay (HOSPITAL_COMMUNITY): Payer: BLUE CROSS/BLUE SHIELD

## 2017-03-22 ENCOUNTER — Encounter (HOSPITAL_COMMUNITY): Payer: Self-pay | Admitting: Cardiovascular Disease

## 2017-03-22 ENCOUNTER — Other Ambulatory Visit: Payer: Self-pay | Admitting: *Deleted

## 2017-03-22 DIAGNOSIS — I10 Essential (primary) hypertension: Secondary | ICD-10-CM

## 2017-03-22 DIAGNOSIS — I2510 Atherosclerotic heart disease of native coronary artery without angina pectoris: Secondary | ICD-10-CM

## 2017-03-22 DIAGNOSIS — E118 Type 2 diabetes mellitus with unspecified complications: Secondary | ICD-10-CM

## 2017-03-22 DIAGNOSIS — I25110 Atherosclerotic heart disease of native coronary artery with unstable angina pectoris: Secondary | ICD-10-CM

## 2017-03-22 DIAGNOSIS — I214 Non-ST elevation (NSTEMI) myocardial infarction: Secondary | ICD-10-CM

## 2017-03-22 DIAGNOSIS — E785 Hyperlipidemia, unspecified: Secondary | ICD-10-CM | POA: Diagnosis present

## 2017-03-22 DIAGNOSIS — I509 Heart failure, unspecified: Secondary | ICD-10-CM

## 2017-03-22 DIAGNOSIS — E119 Type 2 diabetes mellitus without complications: Secondary | ICD-10-CM

## 2017-03-22 HISTORY — PX: LEFT HEART CATH AND CORONARY ANGIOGRAPHY: CATH118249

## 2017-03-22 LAB — GLUCOSE, CAPILLARY
GLUCOSE-CAPILLARY: 127 mg/dL — AB (ref 65–99)
Glucose-Capillary: 89 mg/dL (ref 65–99)
Glucose-Capillary: 144 mg/dL — ABNORMAL HIGH (ref 65–99)
Glucose-Capillary: 98 mg/dL (ref 65–99)
Glucose-Capillary: 134 mg/dL — ABNORMAL HIGH (ref 65–99)

## 2017-03-22 LAB — CBC
HEMATOCRIT: 35.5 % — AB (ref 39.0–52.0)
HEMOGLOBIN: 11.8 g/dL — AB (ref 13.0–17.0)
MCH: 30.4 pg (ref 26.0–34.0)
MCHC: 33.2 g/dL (ref 30.0–36.0)
MCV: 91.5 fL (ref 78.0–100.0)
Platelets: 375 10*3/uL (ref 150–400)
RBC: 3.88 MIL/uL — AB (ref 4.22–5.81)
RDW: 13 % (ref 11.5–15.5)
WBC: 9.3 10*3/uL (ref 4.0–10.5)

## 2017-03-22 LAB — HEPARIN LEVEL (UNFRACTIONATED): Heparin Unfractionated: <0.1 IU/mL — ABNORMAL LOW (ref 0.30–0.70)

## 2017-03-22 LAB — PROTIME-INR
INR: 0.96
Prothrombin Time: 12.7 seconds (ref 11.4–15.2)

## 2017-03-22 SURGERY — LEFT HEART CATH AND CORONARY ANGIOGRAPHY
Anesthesia: LOCAL

## 2017-03-22 MED ORDER — LIDOCAINE HCL 2 % IJ SOLN
INTRAMUSCULAR | Status: AC
  Filled 2017-03-22: qty 10

## 2017-03-22 MED ORDER — LIDOCAINE HCL (PF) 1 % IJ SOLN
INTRAMUSCULAR | Status: DC | PRN
  Administered 2017-03-22: 1 mL

## 2017-03-22 MED ORDER — SODIUM CHLORIDE 0.9% FLUSH: 3.0000 mL | Freq: Two times a day (BID) | INTRAVENOUS | Status: DC

## 2017-03-22 MED ORDER — HEPARIN SODIUM (PORCINE) 1000 UNIT/ML IJ SOLN
INTRAMUSCULAR | Status: DC | PRN
  Administered 2017-03-22: 5000 [IU] via INTRAVENOUS

## 2017-03-22 MED ORDER — SODIUM CHLORIDE 0.9 % IV SOLN: 250.0000 mL | INTRAVENOUS | Status: DC | PRN

## 2017-03-22 MED ORDER — CARVEDILOL 6.25 MG PO TABS
6.2500 mg | ORAL_TABLET | Freq: Two times a day (BID) | ORAL | Status: DC
  Administered 2017-03-22 – 2017-03-24 (×3): 6.25 mg via ORAL
  Filled 2017-03-22 (×3): qty 1

## 2017-03-22 MED ORDER — IOPAMIDOL (ISOVUE-370) INJECTION 76%
INTRAVENOUS | Status: DC | PRN
  Administered 2017-03-22: 80 mL via INTRA_ARTERIAL

## 2017-03-22 MED ORDER — SODIUM CHLORIDE 0.9 % IV SOLN
INTRAVENOUS | Status: DC
  Administered 2017-03-22: 06:00:00 via INTRAVENOUS

## 2017-03-22 MED ORDER — ACETAMINOPHEN 325 MG PO TABS: 650.0000 mg | ORAL_TABLET | ORAL | Status: DC | PRN

## 2017-03-22 MED ORDER — SODIUM CHLORIDE 0.9% FLUSH: 3.0000 mL | INTRAVENOUS | Status: DC | PRN

## 2017-03-22 MED ORDER — HEPARIN (PORCINE) IN NACL 2-0.9 UNIT/ML-% IJ SOLN
INTRAMUSCULAR | Status: AC
  Filled 2017-03-22: qty 1000

## 2017-03-22 MED ORDER — VERAPAMIL HCL 2.5 MG/ML IV SOLN
INTRAVENOUS | Status: AC
  Filled 2017-03-22: qty 2

## 2017-03-22 MED ORDER — VERAPAMIL HCL 2.5 MG/ML IV SOLN
INTRAVENOUS | Status: DC | PRN
  Administered 2017-03-22: 10 mL via INTRA_ARTERIAL

## 2017-03-22 MED ORDER — MORPHINE SULFATE (PF) 2 MG/ML IV SOLN: 2.0000 mg | INTRAVENOUS | Status: DC | PRN

## 2017-03-22 MED ORDER — HEPARIN (PORCINE) IN NACL 2-0.9 UNIT/ML-% IJ SOLN
INTRAMUSCULAR | Status: AC | PRN
  Administered 2017-03-22: 1000 mL

## 2017-03-22 MED ORDER — ONDANSETRON HCL 4 MG/2ML IJ SOLN: 4.0000 mg | Freq: Four times a day (QID) | INTRAMUSCULAR | Status: DC | PRN

## 2017-03-22 MED ORDER — ASPIRIN 81 MG PO CHEW
81.0000 mg | CHEWABLE_TABLET | Freq: Every day | ORAL | Status: DC
  Administered 2017-03-22 – 2017-03-24 (×3): 81 mg via ORAL
  Filled 2017-03-22 (×3): qty 1

## 2017-03-22 MED ORDER — HEPARIN (PORCINE) IN NACL 100-0.45 UNIT/ML-% IJ SOLN
2300.0000 [IU]/h | INTRAMUSCULAR | Status: DC
  Administered 2017-03-23: 2300 [IU]/h via INTRAVENOUS
  Administered 2017-03-23: 2000 [IU]/h via INTRAVENOUS
  Filled 2017-03-22 (×2): qty 250

## 2017-03-22 MED ORDER — HEPARIN (PORCINE) IN NACL 100-0.45 UNIT/ML-% IJ SOLN
1750.0000 [IU]/h | INTRAMUSCULAR | Status: DC
  Administered 2017-03-22: 1750 [IU]/h via INTRAVENOUS
  Filled 2017-03-22: qty 250

## 2017-03-22 MED ORDER — SODIUM CHLORIDE 0.9% FLUSH
3.0000 mL | Freq: Two times a day (BID) | INTRAVENOUS | Status: DC
  Administered 2017-03-22 – 2017-03-23 (×2): 3 mL via INTRAVENOUS

## 2017-03-22 MED ORDER — SODIUM CHLORIDE 0.9 % IV SOLN
INTRAVENOUS | Status: AC
  Administered 2017-03-22: 09:00:00 via INTRAVENOUS

## 2017-03-22 MED ORDER — IOPAMIDOL (ISOVUE-370) INJECTION 76%
INTRAVENOUS | Status: AC
  Filled 2017-03-22: qty 100

## 2017-03-22 MED ORDER — HEPARIN SODIUM (PORCINE) 1000 UNIT/ML IJ SOLN
INTRAMUSCULAR | Status: AC
  Filled 2017-03-22: qty 1

## 2017-03-22 MED ORDER — ASPIRIN 81 MG PO CHEW
81.0000 mg | CHEWABLE_TABLET | ORAL | Status: AC
  Administered 2017-03-22: 81 mg via ORAL
  Filled 2017-03-22: qty 1

## 2017-03-22 SURGICAL SUPPLY — 12 items

## 2017-03-22 NOTE — Consult Note (Addendum)
[address]       [place] [address]             [phone]          CARDIOTHORACIC SURGERY CONSULTATION REPORT  PCP is Wilfrid Lund, PA Referring Provider is Runell Gess, MD Primary Cardiologist is Dietrich Pates, MD (new)  Reason for consultation:  Severe 3-vessel CAD s/p acute non-STEMI  HPI:  Patient is a 46 year old obese white male with history of hypertension, type 2 diabetes mellitus, hyperlipidemia, and remote history of tobacco and polysubstance abuse who has been referred for surgical consultation to discuss treatment options for management of severe three-vessel coronary artery disease status post acute non-ST segment elevation myocardial infarction. The patient has no previous cardiac history and reports no previous history of exertional chest pain or chest tightness. He was in his usual state of health until early last week when he had a broken tooth extracted. 4 days ago he woke up feeling poorly with shortness of breath and low-grade fever without chest pain.  Over the next 24-48 hours she developed worsening shortness of breath and vague right-sided chest discomfort. He went to his primary care physician's office on the morning of 03/20/2017 complaining of shortness of breath. Chest x-ray revealed findings suggestive of early congestive heart failure. White blood count was 15,000. He was given Rocephin and sent to the emergency department where he complained of shortness of breath and mild right-sided chest discomfort. EKG revealed sinus rhythm without acute ST segment changes but troponins were weakly positive 0.32.  Transthoracic echocardiogram revealed mild hypokinesis of the inferior and basilar segment but overall preserved left ventricular systolic function with ejection fraction estimated 60-65%. No other significant abnormalities were noted. The patient underwent diagnostic cardiac catheterization earlier today and was found to have severe  three-vessel coronary artery disease including 99% subtotal occlusion of the mid right coronary artery. Left ventricular systolic function appeared normal. Cardiothoracic surgical consultation was requested.  The patient is single and currently is renting house locally in [place]. 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 He has no children. He does not exercise on a regular basis but he states that his job requires that he is on his feet all the time and frequently has to lift boxes and other heavy objects. Prior to his current illness he denies any previous history of exertional chest pain or chest tightness. He denies any previous history of exertional shortness of breath, resting shortness of breath, PND, orthopnea, or lower extremity edema. He states that he used to weigh 380 pounds but he has lost a tremendous amount of weight over the past 10 years. In addition, in the remote past he had a history of tobacco abuse and polysubstance abuse including cocaine and amphetamines. He denies any previous history of IV drug abuse.  He was first diagnosed with type 2 diabetes a proximally 2 years ago at which time he was hospitalized for flulike symptoms. Hemoglobin A1c was greater than 14 at that time. He has been compliant with medical therapy, uses insulin at home, and recent hemoglobin A1c was 6.3.  Past Medical History:  Diagnosis Date  . Diabetes mellitus without complication (HCC)   . Hypertension     Past Surgical History:  Procedure Laterality Date  . LEFT HEART CATH AND CORONARY ANGIOGRAPHY N/A 03/22/2017   Procedure: LEFT HEART CATH AND CORONARY ANGIOGRAPHY;  Surgeon: Runell Gess, MD;  Location: [facility];  Service: Cardiovascular;  Laterality: N/A;    History reviewed. No pertinent family history.  Social History   Social History  . Marital status: Single    Spouse name: N/A  . Number of children: N/A  . Years of education: N/A   Occupational  History  . manager [employer]   Social History Main Topics  . Smoking status: Never Smoker  . Smokeless tobacco: Never Used  . Alcohol use Yes     Comment: rare  . Drug use: Unknown  . Sexual activity: Not on file   Other Topics Concern  . Not on file   Social History Narrative  . No narrative on file    Prior to Admission medications   Medication Sig Start Date End Date Taking? Authorizing Provider  amoxicillin-clavulanate (AUGMENTIN) 875-125 MG tablet Take 1 tablet by mouth 2 (two) times daily. 03/17/17  Yes [provider]  atorvastatin (LIPITOR) 10 MG tablet Take 10 mg by mouth every evening.   Yes [provider]  fenofibrate (TRICOR) 145 MG tablet Take 145 mg by mouth every evening.   Yes [provider]  ibuprofen (ADVIL,MOTRIN) 800 MG tablet Take 800 mg by mouth every 8 (eight) hours as needed for moderate pain.   Yes [provider]  insulin degludec (TRESIBA FLEXTOUCH) 100 UNIT/ML SOPN FlexTouch Pen Inject 40 Units into the skin every evening.   Yes [provider]  lisinopril-hydrochlorothiazide (PRINZIDE,ZESTORETIC) 20-12.5 MG tablet Take 1 tablet by mouth every evening.   Yes [provider]  metFORMIN (GLUCOPHAGE) 1000 MG tablet Take 1,000 mg by mouth 2 (two) times daily with a meal.   Yes [provider]  Multiple Vitamins-Minerals (MULTIVITAMIN PO) Take 1 tablet by mouth daily.   Yes [provider]    Current Facility-Administered Medications  Medication Dose Route Frequency Provider Last Rate Last Dose  . 0.9 %  sodium chloride infusion  250 mL Intravenous PRN Runell Gess, MD      . acetaminophen (TYLENOL) tablet 650 mg  650 mg Oral Q4H PRN Runell Gess, MD      . amoxicillin-clavulanate (AUGMENTIN) 875-125 MG per tablet 1 tablet  1 tablet Oral BID Marcelino Duster, PA   1 tablet at 03/22/17 1044  . aspirin chewable tablet 81 mg  81 mg Oral Daily Runell Gess, MD   81 mg at  03/22/17 1044  . atorvastatin (LIPITOR) tablet 10 mg  10 mg Oral QPM Marcelino Duster, PA   10 mg at 03/22/17 1726  . carvedilol (COREG) tablet 6.25 mg  6.25 mg Oral BID WC Chilton Si, MD   6.25 mg at 03/22/17 1726  . fenofibrate tablet 160 mg  160 mg Oral QHS Pricilla Riffle, MD   160 mg at 03/21/17 2248  . heparin ADULT infusion 100 units/mL (25000 units/272mL sodium chloride 0.45%)  1,750 Units/hr Intravenous Continuous Pricilla Riffle, MD 17.5 mL/hr at 03/22/17 1051 1,750 Units/hr at 03/22/17 1051  . insulin aspart (novoLOG) injection 0-15 Units  0-15 Units Subcutaneous TID WC Means, Greig Castilla, MD   2 Units at 03/22/17 1356  . insulin aspart (novoLOG) injection 0-5 Units  0-5 Units Subcutaneous QHS Means, Greig Castilla, MD      . insulin glargine (LANTUS) injection 40 Units  40 Units Subcutaneous QHS Pricilla Riffle, MD   Stopped at 03/21/17 2247  . lisinopril (PRINIVIL,ZESTRIL) tablet 20 mg  20 mg Oral QPM Pricilla Riffle, MD   20 mg at 03/22/17  1726  . morphine 2 MG/ML injection 2 mg  2 mg Intravenous Q1H PRN Runell Gess, MD      . nitroGLYCERIN (NITROSTAT) SL tablet 0.4 mg  0.4 mg Sublingual Q5 Min x 3 PRN Duke, Roe Rutherford, PA      . ondansetron [facility]) injection 4 mg  4 mg Intravenous Q6H PRN Runell Gess, MD      . sodium chloride flush (NS) 0.9 % injection 3 mL  3 mL Intravenous Q12H Runell Gess, MD      . sodium chloride flush (NS) 0.9 % injection 3 mL  3 mL Intravenous PRN Runell Gess, MD        No Known Allergies    Review of Systems:   General:  normal appetite, decreased energy, no weight gain, + weight loss, + fever  Cardiac:  no chest pain with exertion, + chest pain at rest, + SOB with exertion, + resting SOB, no PND, no orthopnea, no palpitations, no arrhythmia, no atrial fibrillation, no LE edema, no dizzy spells, no syncope  Respiratory:  + shortness of breath, no home oxygen, no productive cough, no dry cough, + bronchitis, no wheezing,  no hemoptysis, no asthma, no pain with inspiration or cough, no sleep apnea, no CPAP at night  GI:   no difficulty swallowing, no reflux, no frequent heartburn, no hiatal hernia, no abdominal pain, no constipation, no diarrhea, no hematochezia, no hematemesis, no melena  GU:   no dysuria,  no frequency, no urinary tract infection, no hematuria, no enlarged prostate, no kidney stones, no kidney disease  Vascular:  no pain suggestive of claudication, no pain in feet, no leg cramps, no varicose veins, no DVT, no non-healing foot ulcer  Neuro:   no stroke, no TIA's, no seizures, no headaches, no temporary blindness one eye,  no slurred speech, no peripheral neuropathy, no chronic pain, no instability of gait, no memory/cognitive dysfunction  Musculoskeletal: no arthritis, no joint swelling, no myalgias, no difficulty walking, normal mobility   Skin:   no rash, no itching, no skin infections, no pressure sores or ulcerations  Psych:   no anxiety, no depression, no nervousness, no unusual recent stress  Eyes:   no blurry vision, no floaters, no recent vision changes, + wears glasses or contacts  ENT:   no hearing loss, + loose or painful teeth, no dentures, last saw dentist last week  Hematologic:  no easy bruising, no abnormal bleeding, no clotting disorder, no frequent epistaxis  Endocrine:  + diabetes, does check CBG's at home     Physical Exam:   BP 103/65 (BP Location: Left Arm)   Pulse 75   Temp 97.9 F (36.6 C) (Oral)   Resp 11   Ht 5\' 7"  (1.702 m)   Wt 235 lb 9.6 oz (106.9 kg) Comment: a scale  SpO2 97%   BMI 36.90 kg/m   General:  Moderately obese male,  well-appearing  HEENT:  Unremarkable   Neck:   no JVD, no bruits, no adenopathy   Chest:   clear to auscultation, symmetrical breath sounds, no wheezes, no rhonchi   CV:   RRR, no  murmur   Abdomen:  soft, non-tender, no masses   Extremities:  warm, well-perfused, pulses palpable, no lower extremity  edema  Rectal/GU  Deferred  Neuro:   Grossly non-focal and symmetrical throughout  Skin:   Clean and dry, no rashes, no breakdown  Diagnostic Tests:  Transthoracic Echocardiography  Patient:  George Moreno, George Moreno MR #:       [id number] Study Date: 03/21/2017 Gender:     M Age:        46 Height:     170.2 cm Weight:     109.5 kg BSA:        2.32 m^2 Pt. Status: Room:       3E06C   ADMITTING    Dietrich Pates, M.D.  ATTENDING    Ray, Leonidas Romberg, Inpatient  SONOGRAPHER  [facility]  ORDERING     Duke, Roe Rutherford  REFERRING    Marcelino Duster  cc:  ------------------------------------------------------------------- LV EF: 60% -   65%  ------------------------------------------------------------------- Indications:      Chest pain 786.51.  ------------------------------------------------------------------- History:   Risk factors:  Hypertension. Diabetes mellitus.  ------------------------------------------------------------------- Study Conclusions  - Left ventricle: LVEF 60 to 65% with hypokinesis of the inferior   base. Study used Definity. The cavity size was normal. There was   mild concentric hypertrophy. Systolic function was normal. The   estimated ejection fraction was in the range of 60% to 65%. The   study is not technically sufficient to allow evaluation of LV   diastolic function.  ------------------------------------------------------------------- Study data:  No prior study was available for comparison.  Study status:  Routine.  Procedure:  Transthoracic echocardiography. Image quality was adequate. Intravenous contrast (Definity) was administered.  Study completion:  There were no complications.     Transthoracic echocardiography.  M-mode, complete 2D, spectral Doppler, and color Doppler.  Birthdate:  Patient birthdate: [birth year].  Age:  Patient is 46 yr old.  Sex:  Gender: male. BMI: 37.8 kg/m^2.  Blood pressure:      98/62  Patient status: Inpatient.  Study date:  Study date: 03/21/2017. Study time: 11:26 AM.  Location:  Bedside.  -------------------------------------------------------------------  ------------------------------------------------------------------- Left ventricle:  LVEF 60 to 65% with hypokinesis of the inferior base. Study used Definity. The cavity size was normal. There was mild concentric hypertrophy. Systolic function was normal. The estimated ejection fraction was in the range of 60% to 65%. The study is not technically sufficient to allow evaluation of LV diastolic function.  ------------------------------------------------------------------- Aortic valve:   Structurally normal valve.   Cusp separation was normal.  Doppler:  Transvalvular velocity was within the normal range. There was no stenosis. There was no regurgitation.  ------------------------------------------------------------------- Mitral valve:   Structurally normal valve.   Leaflet separation was normal.  Doppler:  Transvalvular velocity was within the normal range. There was no evidence for stenosis. There was no regurgitation.    Peak gradient (D): 5 mm Hg.  ------------------------------------------------------------------- Left atrium:  The atrium was normal in size.  ------------------------------------------------------------------- Right ventricle:  The cavity size was normal. Wall thickness was normal. Systolic function was normal.  ------------------------------------------------------------------- Pulmonic valve:    Structurally normal valve.   Cusp separation was normal.  Doppler:  Transvalvular velocity was within the normal range. There was mild regurgitation.  ------------------------------------------------------------------- Tricuspid valve:   Structurally normal valve.   Leaflet separation was normal.  Doppler:  Transvalvular velocity was within the normal range. There was  mild regurgitation.  ------------------------------------------------------------------- Right atrium:  The atrium was normal in size.  ------------------------------------------------------------------- Pericardium:  There was no pericardial effusion.  ------------------------------------------------------------------- Systemic veins: Inferior vena cava: The vessel was mildly dilated. The respirophasic diameter changes were in the normal range (= 50%), consistent with normal central venous pressure.  ------------------------------------------------------------------- Measurements   Left ventricle  Value        Reference  LV ID, ED, PLAX chordal                  43    mm     43 - 52  LV ID, ES, PLAX chordal                  34    mm     23 - 38  LV fx shortening, PLAX chordal   (L)     21    %      >=29  LV PW thickness, ED                      11.53 mm     ----------  IVS/LV PW ratio, ED                      1.04         <=1.3  Stroke volume, 2D                        84    ml     ----------  Stroke volume/bsa, 2D                    36    ml/m^2 ----------  LV ejection fraction, 1-p A4C            64    %      ----------  LV end-diastolic volume, 2-p             121   ml     ----------  LV end-systolic volume, 2-p              46    ml     ----------  LV ejection fraction, 2-p                62    %      ----------  Stroke volume, 2-p                       75    ml     ----------  LV end-diastolic volume/bsa, 2-p         52    ml/m^2 ----------  LV end-systolic volume/bsa, 2-p          20    ml/m^2 ----------  Stroke volume/bsa, 2-p                   32.2  ml/m^2 ----------  LV e&', lateral                           8.89  cm/s   ----------  LV E/e&', lateral                         13.05        ----------  LV e&', medial                            6.75  cm/s   ----------  LV E/e&', medial                          17.19        ----------  LV e&',  average                           7.82  cm/s   ----------  LV E/e&', average                         14.83        ----------    Ventricular septum                       Value        Reference  IVS thickness, ED                        12    mm     ----------    LVOT                                     Value        Reference  LVOT ID, S                               20    mm     ----------  LVOT area                                3.14  cm^2   ----------  LVOT peak velocity, S                    108   cm/s   ----------  LVOT mean velocity, S                    72.3  cm/s   ----------  LVOT VTI, S                              26.6  cm     ----------  LVOT peak gradient, S                    5     mm Hg  ----------    Aorta                                    Value        Reference  Aortic root ID, ED                       34    mm     ----------    Left atrium                              Value        Reference  LA ID, A-P, ES                           39.29 mm     ----------  LA ID/bsa, A-P  1.69  cm/m^2 <=2.2  LA volume, S                             51.8  ml     ----------  LA volume/bsa, S                         22.3  ml/m^2 ----------  LA volume, ES, 1-p A4C                   37.7  ml     ----------  LA volume/bsa, ES, 1-p A4C               16.2  ml/m^2 ----------  LA volume, ES, 1-p A2C                   59.6  ml     ----------  LA volume/bsa, ES, 1-p A2C               25.7  ml/m^2 ----------    Mitral valve                             Value        Reference  Mitral E-wave peak velocity              116   cm/s   ----------  Mitral A-wave peak velocity              111   cm/s   ----------  Mitral deceleration time                 201   ms     150 - 230  Mitral peak gradient, D                  5     mm Hg  ----------  Mitral E/A ratio, peak                   1            ----------    Right atrium                             Value        Reference  RA ID,  S-I, ES, A4C                      48.2  mm     34 - 49  RA area, ES, A4C                         13.5  cm^2   8.3 - 19.5  RA volume, ES, A/L                       30.2  ml     ----------  RA volume/bsa, ES, A/L                   13    ml/m^2 ----------    Systemic veins                           Value        Reference  Estimated CVP                            8     mm Hg  ----------    Right ventricle                          Value        Reference  RV ID, minor axis, ED, A4C base          35    mm     ----------  Legend: (L)  and  (H)  mark values outside specified reference range.  ------------------------------------------------------------------- Prepared and Electronically Authenticated by  Dietrich Pates, M.D. 2018-09-23T13:06:07    LEFT HEART CATH AND CORONARY ANGIOGRAPHY  Conclusion     The left ventricular systolic function is normal.  LV end diastolic pressure is normal.  The left ventricular ejection fraction is 50-55% by visual estimate.  Ost RCA to Dist RCA lesion, 40 %stenosed.  Mid RCA lesion, 99 %stenosed.  Ost Cx lesion, 50 %stenosed.  Ost 2nd Mrg to 2nd Mrg lesion, 75 %stenosed.  Ost LAD to Prox LAD lesion, 70 %stenosed.   Vandell Kun is a 46 y.o. male    [id number] LOCATION:  FACILITY: [facility]  PHYSICIAN: [name], M.D. [birth year]   DATE OF PROCEDURE:  03/22/2017  DATE OF DISCHARGE:     CARDIAC CATHETERIZATION     History obtained from chart review.46 y.o.malehistroy of DM , HTN, HL who presented on 03/20/17 with shortness of breath and chest tightness. He did have some vascular congestion on chest x-ray. His troponins were fairly low and flat, and his BNP was mildly elevated. His LV function was normal by 2-D echo. His EKG showed LVH with repolarization changes. He presents now for coronary angiography to define his anatomy and rule out an ischemic etiology.   IMPRESSION:Mr. Mcmonigle has three-vessel disease with  preserved LV function. He is a diabetic and has diabetic appearing vessels. He's had a non-STEMI with low, flat troponins.I believe he is agood candidamplete revascularization using coronary artery bypass grafting. The sheath was removed and a TR band was placed on the right wrist to achieve patent hemostasis. The patient left the lab in stable condition. Heparin will be restarted 4 hours after sheath removal without a bolus.  [name]. MD, [facility] 03/22/2017 8:27 AM      Indications   Non-STEMI (non-ST elevated myocardial infarction) (HCC) [I21.4 (ICD-10-CM)]  Procedural Details/Technique   Technical Details PROCEDURE DESCRIPTION:   The patient was brought to the second floor [place] [facility] cath lab in the postabsorptive state. He was not  premedicated . His right wristwas prepped and shaved in usual sterile fashion. Xylocaine 1% was used  for local anesthesia. A 6 French sheath was inserted into the right radial artery using standard Seldinger technique. The patient received 5000 units of heparin intravenously. A 5 [place] TIG catheter and pigtail catheters were used for selective coronary angiography and left ventriculography respectively. Isovue dye was used for the entirety of the case. Retrograde aortic, left ventricular and pullback pressures were recorded. Radial cocktail was administered via the SideArm sheath.   Estimated blood loss <50 mL.  During this procedure no sedation was administered.    Coronary Findings   Dominance: Right  Left Anterior Descending  Ost LAD to Prox LAD lesion, 70% stenosed.  Left Circumflex  Ost Cx lesion, 50% stenosed.  Second [employer]  Marginal Branch  Ost 2nd Mrg to 2nd Mrg lesion, 75% stenosed.  Right Coronary Artery  Ost RCA to Dist RCA lesion, 40% stenosed.  Mid RCA lesion, 99% stenosed. The lesion is thrombotic and ulcerative.  Wall Motion              Left Heart   Left Ventricle The left ventricular size is normal. The  left ventricular systolic function is normal. LV end diastolic pressure is normal. The left ventricular ejection fraction is 50-55% by visual estimate. No regional wall motion abnormalities.    Coronary Diagrams   Diagnostic Diagram       Implants     No implant documentation for this case.  PACS Images   Show images for CARDIAC CATHETERIZATION   Link to Procedure Log   Procedure Log    Hemo Data    Most Recent Value  AO Systolic Pressure 131 mmHg  AO Diastolic Pressure 69 mmHg  AO Mean 94 mmHg  LV Systolic Pressure 124 mmHg  LV Diastolic Pressure 6 mmHg  LV EDP 21 mmHg  Arterial Occlusion Pressure Extended Systolic Pressure 138 mmHg  Arterial Occlusion Pressure Extended Diastolic Pressure 68 mmHg  Arterial Occlusion Pressure Extended Mean Pressure 97 mmHg  Left Ventricular Apex Extended Systolic Pressure 133 mmHg  Left Ventricular Apex Extended Diastolic Pressure 10 mmHg  Left Ventricular Apex Extended EDP Pressure 28 mmHg      Impression:  Patient has severe three-vessel coronary artery disease and presents with somewhat atypical symptoms including transient right-sided chest discomfort and shortness of breath in the setting of low-grade febrile illness. Troponin levels were weakly positive, consistent with acute coronary syndrome and mild non-ST segment elevation myocardial infarction.  Although white blood count was reportedly mildly elevated at the time of presentation, it normalized quickly and the patient has remained afebrile.  There are no other signs of ongoing infection.  I have personally reviewed the patient's transthoracic echocardiogram and diagnostic cardiac catheterization. Echocardiogram reveals essentially normal left ventricular systolic function with perhaps mild hypokinesis of the inferior wall. Diagnostic cardiac catheterization reveals severe three-vessel coronary artery disease including critical 99% stenosis of mid right coronary artery. I agree the  patient would best be treated with surgical revascularization. Risks associated with surgery should be quite low.  Plan:  I have reviewed the indications, risks, and potential benefits of coronary artery bypass grafting with the patient this evening.  Alternative treatment strategies have been discussed, including the relative risks, benefits and long term prognosis associated with medical therapy, percutaneous coronary intervention, and surgical revascularization.  Expectations for his postoperative convalescence following uncomplicated coronary artery bypass grafting were discussed at length. Although the patient is agreeable with plans for surgery, he would currently prefer to consider PCI and stenting of the right coronary artery followed by delayed surgical revascularization because of a variety of circumstances related to his job and home situation. I cannot recommend this option, but under the circumstances it may be a reasonable and acceptably safe alternative. We will have his cath films reviewed by the interventional cardiology team and further discuss options tomorrow.  All questions answered.   I spent in excess of 120 minutes during the conduct of this hospital consultation and >50% of this time involved direct face-to-face encounter for counseling and/or coordination of the patient's care.    Salvatore Decent. Cornelius Moras, MD 03/22/2017 6:27 PM

## 2017-03-22 NOTE — Progress Notes (Signed)
ANTICOAGULATION CONSULT NOTE - Follow Up Consult  Pharmacy Consult for heparin Indication: chest pain/ACS  Labs:  Recent Labs  03/20/17 1115 03/20/17 1806 03/20/17 2006 03/21/17 0000 03/21/17 0521 03/22/17 0410 03/22/17 1915  HGB 11.6*  --   --   --  11.9* 11.8*  --   HCT 34.9*  --   --   --  34.8* 35.5*  --   PLT 304  --   --   --  285 375  --   LABPROT  --   --   --   --   --  12.7  --   INR  --   --   --   --   --  0.96  --   HEPARINUNFRC  --   --  <0.10*  --  <0.10*  --  <0.10*  CREATININE 1.03  --   --   --  1.07  --   --   TROPONINI  --  0.34*  --  0.32* 0.27*  --   --     Assessment: 46yo male s/p cath. Heparin drip resumed 2 hours post sheath removal.  The 8 hour heparin level is <0.1 units/ml on heparin drip rate 1750 units/hr.  Goal of Therapy:  Heparin level 0.3-0.7 units/ml   Plan:  Heparin at 1750 units / hr starting at 11 am 8 hour heparin level Daily heparin level, CBC  Thank you Okey Regal, PharmD [phone] 03/22/2017,8:37 PM  ANTICOAGULATION CONSULT NOTE - Follow Up Consult  Pharmacy Consult for heparin Indication: chest pain/ACS  No Known Allergies  Patient Measurements: Height:  (170.2 cm) Weight: 235 lb 9.6 oz (106.9 kg) (a scale) IBW/kg (Calculated) : 66.1 Heparin Dosing Weight: 90 kg  Vital Signs: Temp: 99.3 F (37.4 C) (09/24 2002) Temp Source: Oral (09/24 2002) BP: 139/73 (09/24 2002) Pulse Rate: 75 (09/24 1100)  Labs:  Recent Labs  03/20/17 1115 03/20/17 1806 03/20/17 2006 03/21/17 0000 03/21/17 0521 03/22/17 0410 03/22/17 1915  HGB 11.6*  --   --   --  11.9* 11.8*  --   HCT 34.9*  --   --   --  34.8* 35.5*  --   PLT 304  --   --   --  285 375  --   LABPROT  --   --   --   --   --  12.7  --   INR  --   --   --   --   --  0.96  --   HEPARINUNFRC  --   --  <0.10*  --  <0.10*  --  <0.10*  CREATININE 1.03  --   --   --  1.07  --   --   TROPONINI  --  0.34*  --  0.32* 0.27*  --   --     Estimated Creatinine  Clearance: 100.5 mL/min (by C-G formula based on SCr of 1.07 mg/dL).   Medications:  Scheduled:  . amoxicillin-clavulanate  1 tablet Oral BID  . aspirin  81 mg Oral Daily  . atorvastatin  10 mg Oral QPM  . carvedilol  6.25 mg Oral BID WC  . fenofibrate  160 mg Oral QHS  . insulin aspart  0-15 Units Subcutaneous TID WC  . insulin aspart  0-5 Units Subcutaneous QHS  . insulin glargine  40 Units Subcutaneous QHS  . lisinopril  20 mg Oral QPM  . sodium chloride flush  3 mL Intravenous Q12H  Assessment: 46yo male s/p cath today 03/22/17. Severe 3 vessel CAD s/p acute non-STEMI.  Heparin drip resumed 2 hours post sheath removal.  The 8 hour heparin level is <0.1 units/ml on heparin drip rate 1750 units/hr.  RN reports IV site is good, heparin rate is correct and no bleeding noted per RN.   TCTS consulted  for CABG. Patient prefers to consider PCI and stenting of the RCA followed by delayed surgical revascularization. Dr. Cornelius Moras does not recommend this option.  Cardiology/TCTS team to discuss options tomorrow.    Goal of Therapy:  Heparin level 0.3-0.7 units/ml Monitor platelets by anticoagulation protocol: Yes   Plan:  Increase heparin to 2000 units/hr Check 6hour heparin level Daily heparin level, CBC  Noah Delaine, RPh Clinical Pharmacist 03/22/2017,8:39 PM

## 2017-03-22 NOTE — H&P (View-Only) (Signed)
Reviewed echo  LVEF normal with basal inferior hypokinesis   I have discussed with pt  Concerning with this his histroy of DM  and presentation with elevated trop and CHF for CAD  Would evaluate  Discussed CT vs cath  Pt has demanding physical job  Decide to proceed with LHC in am   Risks and benefits described  Pt understands and agrees to proceed.    Kala Ambriz  

## 2017-03-22 NOTE — Progress Notes (Signed)
ANTICOAGULATION CONSULT NOTE - Follow Up Consult  Pharmacy Consult for heparin Indication: chest pain/ACS  Labs:  Recent Labs  03/20/17 1115 03/20/17 1806 03/20/17 2006 03/21/17 0000 03/21/17 0521 03/22/17 0410  HGB 11.6*  --   --   --  11.9* 11.8*  HCT 34.9*  --   --   --  34.8* 35.5*  PLT 304  --   --   --  285 375  LABPROT  --   --   --   --   --  12.7  INR  --   --   --   --   --  0.96  HEPARINUNFRC  --   --  <0.10*  --  <0.10*  --   CREATININE 1.03  --   --   --  1.07  --   TROPONINI  --  0.34*  --  0.32* 0.27*  --     Assessment: 46yo male s/p cath to resume heparin 2 hours post sheath removal  Goal of Therapy:  Heparin level 0.3-0.7 units/ml   Plan:  Heparin at 1750 units / hr starting at 11 am 8 hour heparin level Daily heparin level, CBC  Thank you Okey Regal, PharmD [phone] 03/22/2017,8:39 AM

## 2017-03-22 NOTE — Progress Notes (Signed)
Progress Note  Patient Name: George Moreno Date of Encounter: 03/22/2017  Primary Cardiologist: New  Subjective   The patient has returned from his cardiac cath this morning. His right wrist cath site is stable. He has no chest pain or dyspnea. He is upset about his needed CABG. He says that his work is very physical and he will lose his job and subsequently his home.   Inpatient Medications    Scheduled Meds: . amoxicillin-clavulanate  1 tablet Oral BID  . aspirin  81 mg Oral Daily  . atorvastatin  10 mg Oral QPM  . fenofibrate  160 mg Oral QHS  . lisinopril  20 mg Oral QPM   And  . hydrochlorothiazide  12.5 mg Oral QPM  . insulin aspart  0-15 Units Subcutaneous TID WC  . insulin aspart  0-5 Units Subcutaneous QHS  . insulin glargine  40 Units Subcutaneous QHS  . sodium chloride flush  3 mL Intravenous Q12H   Continuous Infusions: . sodium chloride 75 mL/hr at 03/22/17 0845  . sodium chloride    . heparin 1,750 Units/hr (03/22/17 1051)   PRN Meds: sodium chloride, acetaminophen, morphine injection, nitroGLYCERIN, ondansetron (ZOFRAN) IV, sodium chloride flush   Vital Signs    Vitals:   03/22/17 0757 03/22/17 0802 03/22/17 0806 03/22/17 0811  BP: 133/85 137/85 130/82 (!) 143/87  Pulse: 73 70 74 74  Resp: Temp:      TempSrc:      SpO2: 99% 99% 97% 97%  Weight:      Height:        Intake/Output Summary (Last 24 hours) at 03/22/17 1101 Last data filed at 03/22/17 0629  Gross per 24 hour  Intake           585.61 ml  Output             2200 ml  Net         -1614.39 ml   Filed Weights   03/20/17 1757 03/21/17 0630 03/22/17 0516  Weight: 243 lb 1.6 oz (110.3 kg) 241 lb 4.8 oz (109.5 kg) 235 lb 9.6 oz (106.9 kg)    Telemetry    SR in the 70's  - Personally Reviewed  ECG    No new tracings  Physical Exam  Physical Exam  Constitutional: He is oriented to person, place, and time. He appears well-developed and well-nourished. No distress.    HENT:  Head: Normocephalic and atraumatic.  Neck: Normal range of motion. Neck supple. No JVD present.  Cardiovascular: Normal rate, regular rhythm and normal heart sounds.  Exam reveals no gallop and no friction rub.   No murmur heard. Pulmonary/Chest: Effort normal and breath sounds normal. No respiratory distress. He has no wheezes. He has no rales.  Abdominal: Soft. Bowel sounds are normal.  Musculoskeletal: Normal range of motion. He exhibits no edema.  Neurological: He is alert and oriented to person, place, and time.  Skin: Skin is warm and dry.  Psychiatric: His behavior is normal. Thought content normal.    Labs    Chemistry  Recent Labs Lab 03/20/17 1115 03/21/17 0521  NA 137 137  K 3.8 3.7  CL 106 102  CO2 24 27  GLUCOSE 141* 110*  BUN 16 18  CREATININE 1.03 1.07  CALCIUM 9.1 9.3  GFRNONAA >60 >60  GFRAA >60 >60  ANIONGAP 7 8     Hematology  Recent Labs Lab 03/20/17 1115 03/21/17 0521 03/22/17 0410  WBC  14.4* 10.6* 9.3  RBC 3.78* 3.78* 3.88*  HGB 11.6* 11.9* 11.8*  HCT 34.9* 34.8* 35.5*  MCV 92.3 92.1 91.5  MCH 30.7 31.5 30.4  MCHC 33.2 34.2 33.2  RDW 13.3 13.1 13.0  PLT 304 285 375    Cardiac Enzymes  Recent Labs Lab 03/20/17 1806 03/21/17 0000 03/21/17 0521  TROPONINI 0.34* 0.32* 0.27*     Recent Labs Lab 03/20/17 1122  TROPIPOC 0.16*     BNP  Recent Labs Lab 03/20/17 1246  BNP 469.2*     DDimer No results for input(s): DDIMER in the last 168 hours.   Radiology    Dg Chest Portable 1 View  Result Date: 03/20/2017 CLINICAL DATA:  Shortness of breath and possible CHF. EXAM: PORTABLE CHEST 1 VIEW COMPARISON:  03/20/2017 FINDINGS: AP portable view of the chest was obtained. Slightly low lung volumes. Again noted are prominent central vascular structures. Prominent lung markings at the right lung base. Heart size is within normal limits. Trachea is midline. Negative for a pneumothorax. IMPRESSION: Prominent central vascular  structures, particularly in the right lower chest. Findings may be related to low lung volumes and vascular congestion. Electronically Signed   By: Richarda Overlie M.D.   On: 03/20/2017 12:31    Cardiac Studies   LEFT HEART CATH AND CORONARY ANGIOGRAPHY   03/22/17  Conclusion   The left ventricular systolic function is normal.  LV end diastolic pressure is normal.  The left ventricular ejection fraction is 50-55% by visual estimate.  Ost RCA to Dist RCA lesion, 40 %stenosed.  Mid RCA lesion, 99 %stenosed.  Ost Cx lesion, 50 %stenosed.  Ost 2nd Mrg to 2nd Mrg lesion, 75 %stenosed.  Ost LAD to Prox LAD lesion, 70 %stenosed.   IMPRESSION:Mr. Silbaugh has three-vessel disease with preserved LV function. He is a diabetic and has diabetic appearing vessels. He's had a non-STEMI with low, flat troponins.I believe he is agood candidamplete revascularization using coronary artery bypass grafting. The sheath was removed and a TR band was placed on the right wrist to achieve patent hemostasis. The patient left the lab in stable condition. Heparin will be restarted 4 hours after sheath removal without a bolus.  Echocardiogram 03/21/17 Study Conclusions  - Left ventricle: LVEF 60 to 65% with hypokinesis of the inferior   base. Study used Definity. The cavity size was normal. There was   mild concentric hypertrophy. Systolic function was normal. The   estimated ejection fraction was in the range of 60% to 65%. The   study is not technically sufficient to allow evaluation of LV   diastolic function.  Patient Profile     46 y.o. male histroy of DM , HTN, HL  Presents with several day history of fever, chills, SOB    Assessment & Plan    1  CAD: shortness of breath has improved, however, patient had mildly elevated troponin and echo showed basal inferior hypokinesis. The patient was taken for cardiac catheterization today which showed multivessel CAD with 99% stenosed RCA, 75% stenosed second  marginal and 70% stenosis in the ostial LAD to proximal LAD. He has preserved LV function. Currently he is being worked up for potential surgical revascularization. He is continued on heparin infusion, aspirin 81 mg, and statin. Consider starting beta blocker. Blood pressure is stable.  2  Fever  Had fever on Thurs and Friday. Treated for resp infection at walk in clinic.  Now Afebrile    3  HTN  BP is OK .  He is on hydrochlorothiazide and lisinopril as per his home regimen  4  DM  On insulin. A1C 6.2. On sliding scale insulin while in hospital.   5  HL  HDL low  LDL 62  Keep on meds, atorvastatin 10 mg and fenofibrate 145 mg.   For questions or updates, please contact [facility] Please consult [email or web address] for contact info under Cardiology/STEMI.      Signed, Berton Bon, NP  03/22/2017, 11:01 AM

## 2017-03-22 NOTE — Interval H&P Note (Signed)
Cath Lab Visit (complete for each Cath Lab visit)  Clinical Evaluation Leading to the Procedure:   ACS: Yes.    Non-ACS:    Anginal Classification: CCS III  Anti-ischemic medical therapy: No Therapy  Non-Invasive Test Results: No non-invasive testing performed  Prior CABG: No previous CABG      History and Physical Interval Note:  03/22/2017 7:37 AM  George Moreno  has presented today for surgery, with the diagnosis of NSTEMI  The various methods of treatment have been discussed with the patient and family. After consideration of risks, benefits and other options for treatment, the patient has consented to  Procedure(s): LEFT HEART CATH AND CORONARY ANGIOGRAPHY (N/A) as a surgical intervention .  The patient's history has been reviewed, patient examined, no change in status, stable for surgery.  I have reviewed the patient's chart and labs.  Questions were answered to the patient's satisfaction.     [name]

## 2017-03-23 ENCOUNTER — Inpatient Hospital Stay (HOSPITAL_COMMUNITY)
Admission: EM | Disposition: A | Discharge status: Home / Self Care | Payer: Self-pay | Source: Home / Self Care | Attending: Internal Medicine

## 2017-03-23 ENCOUNTER — Inpatient Hospital Stay (HOSPITAL_COMMUNITY): Payer: BLUE CROSS/BLUE SHIELD

## 2017-03-23 ENCOUNTER — Encounter (HOSPITAL_COMMUNITY): Payer: BLUE CROSS/BLUE SHIELD

## 2017-03-23 ENCOUNTER — Other Ambulatory Visit (HOSPITAL_COMMUNITY): Payer: BLUE CROSS/BLUE SHIELD

## 2017-03-23 DIAGNOSIS — I5031 Acute diastolic (congestive) heart failure: Secondary | ICD-10-CM

## 2017-03-23 HISTORY — PX: CORONARY STENT INTERVENTION: CATH118234

## 2017-03-23 LAB — BLOOD GAS, ARTERIAL
ACID-BASE EXCESS: 1.7 mmol/L (ref 0.0–2.0)
Bicarbonate: 26 mmol/L (ref 20.0–28.0)
DRAWN BY: 422461
FIO2: 21
O2 Saturation: 94 %
PCO2 ART: 42.9 mmHg (ref 32.0–48.0)
PH ART: 7.4 (ref 7.350–7.450)
Patient temperature: 98.7
pO2, Arterial: 73.4 mmHg — ABNORMAL LOW (ref 83.0–108.0)

## 2017-03-23 LAB — CBC
HEMATOCRIT: 35.9 % — AB (ref 39.0–52.0)
HEMOGLOBIN: 11.9 g/dL — AB (ref 13.0–17.0)
MCH: 30.1 pg (ref 26.0–34.0)
MCHC: 33.1 g/dL (ref 30.0–36.0)
MCV: 90.9 fL (ref 78.0–100.0)
Platelets: 370 10*3/uL (ref 150–400)
RBC: 3.95 MIL/uL — ABNORMAL LOW (ref 4.22–5.81)
RDW: 12.8 % (ref 11.5–15.5)
WBC: 8.4 10*3/uL (ref 4.0–10.5)

## 2017-03-23 LAB — GLUCOSE, CAPILLARY
GLUCOSE-CAPILLARY: 90 mg/dL (ref 65–99)
GLUCOSE-CAPILLARY: 99 mg/dL (ref 65–99)
Glucose-Capillary: 77 mg/dL (ref 65–99)
Glucose-Capillary: 112 mg/dL — ABNORMAL HIGH (ref 65–99)

## 2017-03-23 LAB — COMPREHENSIVE METABOLIC PANEL
ALBUMIN: 2.9 g/dL — AB (ref 3.5–5.0)
ALK PHOS: 34 U/L — AB (ref 38–126)
ALT: 25 U/L (ref 17–63)
AST: 23 U/L (ref 15–41)
Anion gap: 7 (ref 5–15)
BUN: 21 mg/dL — ABNORMAL HIGH (ref 6–20)
CALCIUM: 9.2 mg/dL (ref 8.9–10.3)
CO2: 26 mmol/L (ref 22–32)
CREATININE: 1.06 mg/dL (ref 0.61–1.24)
Chloride: 104 mmol/L (ref 101–111)
GFR calc non Af Amer: >60 mL/min (ref >60)
GLUCOSE: 133 mg/dL — AB (ref 65–99)
Potassium: 4 mmol/L (ref 3.5–5.1)
SODIUM: 137 mmol/L (ref 135–145)
Total Bilirubin: 0.4 mg/dL (ref 0.3–1.2)
Total Protein: 6 g/dL — ABNORMAL LOW (ref 6.5–8.1)

## 2017-03-23 LAB — POCT ACTIVATED CLOTTING TIME
ACTIVATED CLOTTING TIME: 230 s
Activated Clotting Time: 224 seconds
Activated Clotting Time: 323 seconds

## 2017-03-23 LAB — PROTIME-INR
INR: 1.03
Prothrombin Time: 13.4 seconds (ref 11.4–15.2)

## 2017-03-23 LAB — HEMOGLOBIN A1C
Hgb A1c MFr Bld: 6.3 % — ABNORMAL HIGH (ref 4.8–5.6)
Mean Plasma Glucose: 134.11 mg/dL

## 2017-03-23 LAB — TYPE AND SCREEN
ABO/RH(D): O POS
ANTIBODY SCREEN: NEGATIVE

## 2017-03-23 LAB — APTT: aPTT: 52 seconds — ABNORMAL HIGH (ref 24–36)

## 2017-03-23 LAB — HEPARIN LEVEL (UNFRACTIONATED): HEPARIN UNFRACTIONATED: 0.14 [IU]/mL — AB (ref 0.30–0.70)

## 2017-03-23 LAB — ABO/RH: ABO/RH(D): O POS

## 2017-03-23 SURGERY — CORONARY STENT INTERVENTION
Anesthesia: LOCAL

## 2017-03-23 MED ORDER — IOPAMIDOL (ISOVUE-370) INJECTION 76%
INTRAVENOUS | Status: AC
  Filled 2017-03-23: qty 100

## 2017-03-23 MED ORDER — NITROGLYCERIN 1 MG/10 ML FOR IR/CATH LAB
INTRA_ARTERIAL | Status: DC | PRN
  Administered 2017-03-23 (×2): 200 ug

## 2017-03-23 MED ORDER — IOPAMIDOL (ISOVUE-370) INJECTION 76%
INTRAVENOUS | Status: DC | PRN
  Administered 2017-03-23: 220 mL via INTRA_ARTERIAL

## 2017-03-23 MED ORDER — SODIUM CHLORIDE 0.9 % WEIGHT BASED INFUSION: 1.0000 mL/kg/h | INTRAVENOUS | Status: DC

## 2017-03-23 MED ORDER — HEPARIN (PORCINE) IN NACL 2-0.9 UNIT/ML-% IJ SOLN
INTRAMUSCULAR | Status: DC | PRN
  Administered 2017-03-23: 10 mL via INTRA_ARTERIAL

## 2017-03-23 MED ORDER — SODIUM CHLORIDE 0.9% FLUSH: 3.0000 mL | Freq: Two times a day (BID) | INTRAVENOUS | Status: DC

## 2017-03-23 MED ORDER — IOPAMIDOL (ISOVUE-370) INJECTION 76%
INTRAVENOUS | Status: AC
Start: 2017-03-23 — End: 2017-03-23
  Filled 2017-03-23: qty 100

## 2017-03-23 MED ORDER — MIDAZOLAM HCL 2 MG/2ML IJ SOLN
INTRAMUSCULAR | Status: AC
  Filled 2017-03-23: qty 2

## 2017-03-23 MED ORDER — HEPARIN SODIUM (PORCINE) 1000 UNIT/ML IJ SOLN
INTRAMUSCULAR | Status: DC | PRN
  Administered 2017-03-23: 10000 [IU] via INTRAVENOUS
  Administered 2017-03-23: 4000 [IU] via INTRAVENOUS
  Administered 2017-03-23: 5000 [IU] via INTRAVENOUS

## 2017-03-23 MED ORDER — VERAPAMIL HCL 2.5 MG/ML IV SOLN
INTRAVENOUS | Status: AC
  Filled 2017-03-23: qty 2

## 2017-03-23 MED ORDER — MIDAZOLAM HCL 2 MG/2ML IJ SOLN
INTRAMUSCULAR | Status: DC | PRN
  Administered 2017-03-23: 2 mg via INTRAVENOUS
  Administered 2017-03-23: 1 mg via INTRAVENOUS

## 2017-03-23 MED ORDER — FENTANYL CITRATE (PF) 100 MCG/2ML IJ SOLN
INTRAMUSCULAR | Status: AC
  Filled 2017-03-23: qty 2

## 2017-03-23 MED ORDER — SODIUM CHLORIDE 0.9 % WEIGHT BASED INFUSION: 1.0000 mL/kg/h | INTRAVENOUS | Status: AC

## 2017-03-23 MED ORDER — ANGIOPLASTY BOOK
Freq: Once | Status: AC
  Administered 2017-03-23: 21:00:00
  Filled 2017-03-23: qty 1

## 2017-03-23 MED ORDER — HEPARIN (PORCINE) IN NACL 2-0.9 UNIT/ML-% IJ SOLN
INTRAMUSCULAR | Status: AC
  Filled 2017-03-23: qty 1000

## 2017-03-23 MED ORDER — SODIUM CHLORIDE 0.9% FLUSH: 3.0000 mL | INTRAVENOUS | Status: DC | PRN

## 2017-03-23 MED ORDER — SODIUM CHLORIDE 0.9% FLUSH
3.0000 mL | Freq: Two times a day (BID) | INTRAVENOUS | Status: DC
  Administered 2017-03-23: 3 mL via INTRAVENOUS

## 2017-03-23 MED ORDER — HEPARIN (PORCINE) IN NACL 2-0.9 UNIT/ML-% IJ SOLN
INTRAMUSCULAR | Status: AC | PRN
  Administered 2017-03-23: 1000 mL

## 2017-03-23 MED ORDER — LIDOCAINE HCL 2 % IJ SOLN
INTRAMUSCULAR | Status: AC
  Filled 2017-03-23: qty 10

## 2017-03-23 MED ORDER — HEART ATTACK BOUNCING BOOK
Freq: Once | Status: AC
  Administered 2017-03-23: 21:00:00
  Filled 2017-03-23: qty 1

## 2017-03-23 MED ORDER — HEPARIN SODIUM (PORCINE) 1000 UNIT/ML IJ SOLN
INTRAMUSCULAR | Status: AC
Start: 2017-03-23 — End: 2017-03-23
  Filled 2017-03-23: qty 1

## 2017-03-23 MED ORDER — TICAGRELOR 90 MG PO TABS
180.0000 mg | ORAL_TABLET | Freq: Once | ORAL | Status: AC
  Administered 2017-03-23: 180 mg via ORAL
  Filled 2017-03-23: qty 2

## 2017-03-23 MED ORDER — ENOXAPARIN SODIUM 40 MG/0.4ML ~~LOC~~ SOLN: 40.0000 mg | SUBCUTANEOUS | Status: DC

## 2017-03-23 MED ORDER — SODIUM CHLORIDE 0.9 % WEIGHT BASED INFUSION
3.0000 mL/kg/h | INTRAVENOUS | Status: DC
  Administered 2017-03-23: 3 mL/kg/h via INTRAVENOUS

## 2017-03-23 MED ORDER — ATORVASTATIN CALCIUM 80 MG PO TABS
80.0000 mg | ORAL_TABLET | Freq: Every evening | ORAL | Status: DC
  Administered 2017-03-23: 80 mg via ORAL
  Filled 2017-03-23: qty 1

## 2017-03-23 MED ORDER — SODIUM CHLORIDE 0.9 % WEIGHT BASED INFUSION: 3.0000 mL/kg/h | INTRAVENOUS | Status: DC

## 2017-03-23 MED ORDER — SODIUM CHLORIDE 0.9 % IV SOLN: 250.0000 mL | INTRAVENOUS | Status: DC | PRN

## 2017-03-23 MED ORDER — SODIUM CHLORIDE 0.9 % WEIGHT BASED INFUSION
1.0000 mL/kg/h | INTRAVENOUS | Status: DC
  Administered 2017-03-23: 1 mL/kg/h via INTRAVENOUS

## 2017-03-23 MED ORDER — LIDOCAINE HCL (PF) 1 % IJ SOLN
INTRAMUSCULAR | Status: DC | PRN
  Administered 2017-03-23: 2 mL

## 2017-03-23 MED ORDER — HEPARIN SODIUM (PORCINE) 1000 UNIT/ML IJ SOLN
INTRAMUSCULAR | Status: AC
  Filled 2017-03-23: qty 1

## 2017-03-23 MED ORDER — TICAGRELOR 90 MG PO TABS
90.0000 mg | ORAL_TABLET | Freq: Two times a day (BID) | ORAL | Status: DC
  Administered 2017-03-24 (×2): 90 mg via ORAL
  Filled 2017-03-23 (×2): qty 1

## 2017-03-23 MED ORDER — NITROGLYCERIN 1 MG/10 ML FOR IR/CATH LAB
INTRA_ARTERIAL | Status: AC
  Filled 2017-03-23: qty 10

## 2017-03-23 MED ORDER — FENTANYL CITRATE (PF) 100 MCG/2ML IJ SOLN
INTRAMUSCULAR | Status: DC | PRN
  Administered 2017-03-23 (×2): 25 ug via INTRAVENOUS

## 2017-03-23 MED ORDER — FLUMAZENIL 1 MG/10ML IV SOLN
INTRAVENOUS | Status: AC
Start: 2017-03-23 — End: 2017-03-23
  Filled 2017-03-23: qty 10

## 2017-03-23 SURGICAL SUPPLY — 24 items
BALLN SAPPHIRE 2.25X15 (BALLOONS) ×2
BALLN SAPPHIRE 2.5X20 (BALLOONS) ×2
BALLN ~~LOC~~ EMERGE MR 3.0X15 (BALLOONS) ×2
BALLN ~~LOC~~ EMERGE MR 3.75X15 (BALLOONS) ×2
BALLOON SAPPHIRE 2.25X15 (BALLOONS) ×1 IMPLANT
BALLOON SAPPHIRE 2.5X20 (BALLOONS) ×1 IMPLANT
BALLOON ~~LOC~~ EMERGE MR 3.0X15 (BALLOONS) ×1 IMPLANT
BALLOON ~~LOC~~ EMERGE MR 3.75X15 (BALLOONS) ×1 IMPLANT
CATH LAUNCHER 6FR AL.75 (CATHETERS) ×2 IMPLANT
CATH VISTA GUIDE 6FR XBLAD3.5 (CATHETERS) ×2 IMPLANT
DEVICE RAD COMP TR BAND LRG (VASCULAR PRODUCTS) ×2 IMPLANT
GLIDESHEATH SLEND SS 6F .021 (SHEATH) ×2 IMPLANT
GUIDEWIRE INQWIRE 1.5J.035X260 (WIRE) ×1 IMPLANT
INQWIRE 1.5J .035X260CM (WIRE) ×2
KIT ENCORE 26 ADVANTAGE (KITS) ×2 IMPLANT
KIT HEART LEFT (KITS) ×2 IMPLANT
PACK CARDIAC CATHETERIZATION (CUSTOM PROCEDURE TRAY) ×2 IMPLANT
STENT PROMUS PREM MR 3.5X24 (Permanent Stent) ×2 IMPLANT
STENT SYNERGY DES 2.75X20 (Permanent Stent) ×2 IMPLANT
STENT SYNERGY DES 2.75X28 (Permanent Stent) ×2 IMPLANT
TRANSDUCER W/STOPCOCK (MISCELLANEOUS) ×2 IMPLANT
TUBING CIL FLEX 10 FLL-RA (TUBING) ×2 IMPLANT
WIRE FIGHTER CROSSING 190CM (WIRE) ×2 IMPLANT
WIRE PT2 MS 185 (WIRE) ×2 IMPLANT

## 2017-03-23 NOTE — Progress Notes (Signed)
Chaplain stopped in to introduce herself to patient.  Patient is explaining the procedure he will have, a stent and thinks he will be fine.  Chaplain will continue to follow as needed.   03/23/17 1145  Clinical Encounter Type  Visited With Patient  Visit Type Initial;Psychological support;Spiritual support

## 2017-03-23 NOTE — Progress Notes (Signed)
ANTICOAGULATION CONSULT NOTE - Follow Up Consult  Pharmacy Consult for heparin Indication: CAD awaiting PCI vs CABG  Labs:  Recent Labs  03/20/17 1115 03/20/17 1806  03/21/17 0000 03/21/17 0521 03/22/17 0410 03/22/17 1915 03/23/17 0248  HGB 11.6*  --   --   --  11.9* 11.8*  --  11.9*  HCT 34.9*  --   --   --  34.8* 35.5*  --  35.9*  PLT 304  --   --   --  285 375  --  370  APTT  --   --   --   --   --   --   --  52*  LABPROT  --   --   --   --   --  12.7  --  13.4  INR  --   --   --   --   --  0.96  --  1.03  HEPARINUNFRC  --   --   < >  --  <0.10*  --  <0.10* 0.14*  CREATININE 1.03  --   --   --  1.07  --   --  1.06  TROPONINI  --  0.34*  --  0.32* 0.27*  --   --   --   < > = values in this interval not displayed.   Assessment: 46yo male remains subtherapeutic on heparin after rate change.  Goal of Therapy:  Heparin level 0.3-0.7 units/ml   Plan:  Will increase heparin gtt by 3 units/kg/hr to 2300 units/hr and check level in 6hr.  Vernard Gambles, PharmD, BCPS  03/23/2017,4:00 AM

## 2017-03-23 NOTE — Interval H&P Note (Signed)
History and Physical Interval Note:  03/23/2017 3:30 PM  George Moreno  has presented today for surgery, with the diagnosis of cad  The various methods of treatment have been discussed with the patient and family. After consideration of risks, benefits and other options for treatment, the patient has consented to  Procedure(s): CORONARY STENT INTERVENTION (N/A) as a surgical intervention .  The patient's history has been reviewed, patient examined, no change in status, stable for surgery.  I have reviewed the patient's chart and labs.  Questions were answered to the patient's satisfaction.   Cath Lab Visit (complete for each Cath Lab visit)  Clinical Evaluation Leading to the Procedure:   ACS: Yes.    Non-ACS:    Anginal Classification: CCS IV  Anti-ischemic medical therapy: Minimal Therapy (1 class of medications)  Non-Invasive Test Results: No non-invasive testing performed  Prior CABG: No previous CABG        Theron [facility] 03/23/2017 3:30 PM

## 2017-03-23 NOTE — Progress Notes (Signed)
Progress Note  Patient Name: Roland Lipke Date of Encounter: 03/23/2017  Primary Cardiologist: Dr. Tenny Craw (new)  Subjective   Feeling well.  Denies chest pain shortness of breath.  Inpatient Medications    Scheduled Meds: . amoxicillin-clavulanate  1 tablet Oral BID  . aspirin  81 mg Oral Daily  . atorvastatin  10 mg Oral QPM  . carvedilol  6.25 mg Oral BID WC  . fenofibrate  160 mg Oral QHS  . insulin aspart  0-15 Units Subcutaneous TID WC  . insulin aspart  0-5 Units Subcutaneous QHS  . insulin glargine  40 Units Subcutaneous QHS  . lisinopril  20 mg Oral QPM  . sodium chloride flush  3 mL Intravenous Q12H   Continuous Infusions: . sodium chloride    . heparin 2,300 Units/hr (03/23/17 0404)   PRN Meds: sodium chloride, acetaminophen, morphine injection, nitroGLYCERIN, ondansetron (ZOFRAN) IV, sodium chloride flush   Vital Signs    Vitals:   03/22/17 2002 03/23/17 0020 03/23/17 0536 03/23/17 0738  BP: 139/73 100/65 138/76 125/72  Pulse:  77 66 66  Resp: Temp: 99.3 F (37.4 C) 98.7 F (37.1 C) 98.2 F (36.8 C) 97.9 F (36.6 C)  TempSrc: Oral Oral Oral Oral  SpO2: 95% 100% 98% 98%  Weight:   107.3 kg (236 lb 8 oz)   Height:        Intake/Output Summary (Last 24 hours) at 03/23/17 0914 Last data filed at 03/23/17 0651  Gross per 24 hour  Intake              720 ml  Output             2420 ml  Net            -1700 ml   Filed Weights   03/21/17 0630 03/22/17 0516 03/23/17 0536  Weight: 109.5 kg (241 lb 4.8 oz) 106.9 kg (235 lb 9.6 oz) 107.3 kg (236 lb 8 oz)    Telemetry    Sinus rhythm. No events - Personally Reviewed  ECG    Sinus rhythm. Rate 69 bpm. - Personally Reviewed  Physical Exam   GEN: Well-appearing.  No acute distress.   Neck: No JVD Cardiac: RRR, no murmurs, rubs, or gallops.  Respiratory: Clear to auscultation bilaterally. GI: Soft, nontender, non-distended  MS: No edema; No deformity. Neuro:  Nonfocal  Psych:  Normal affect   Labs    Chemistry Recent Labs Lab 03/20/17 1115 03/21/17 0521 03/23/17 0248  NA 137 137 137  K 3.8 3.7 4.0  CL 106 102 104  CO2 GLUCOSE 141* 110* 133*  BUN 16 18 21*  CREATININE 1.03 1.07 1.06  CALCIUM 9.1 9.3 9.2  PROT  --   --  6.0*  ALBUMIN  --   --  2.9*  AST  --   --  23  ALT  --   --  25  ALKPHOS  --   --  34*  BILITOT  --   --  0.4  GFRNONAA >60 >60 >60  GFRAA >60 >60 >60  ANIONGAP Hematology Recent Labs Lab 03/21/17 0521 03/22/17 0410 03/23/17 0248  WBC 10.6* 9.3 8.4  RBC 3.78* 3.88* 3.95*  HGB 11.9* 11.8* 11.9*  HCT 34.8* 35.5* 35.9*  MCV 92.1 91.5 90.9  MCH 31.5 30.4 30.1  MCHC 34.2 33.2 33.1  RDW 13.1 13.0 12.8  PLT 285 375 370  Cardiac Enzymes Recent Labs Lab 03/20/17 1806 03/21/17 0000 03/21/17 0521  TROPONINI 0.34* 0.32* 0.27*    Recent Labs Lab 03/20/17 1122  TROPIPOC 0.16*     BNP Recent Labs Lab 03/20/17 1246  BNP 469.2*     DDimer No results for input(s): DDIMER in the last 168 hours.   Radiology    Dg Chest 2 View  Result Date: 03/23/2017 CLINICAL DATA:  Chest pain. EXAM: CHEST  2 VIEW COMPARISON:  Radiograph of March 20, 2017. FINDINGS: The heart size and mediastinal contours are within normal limits. No pneumothorax is noted. Left lung is clear. Minimal right basilar subsegmental atelectasis is noted with minimal right pleural effusion. The visualized skeletal structures are unremarkable. IMPRESSION: Minimal right basilar subsegmental atelectasis with minimal right pleural effusion. Electronically Signed   By: Lupita Raider, M.D.   On: 03/23/2017 07:41    Cardiac Studies   Echo 03/21/17: Study Conclusions  - Left ventricle: LVEF 60 to 65% with hypokinesis of the inferior   base. Study used Definity. The cavity size was normal. There was   mild concentric hypertrophy. Systolic function was normal. The   estimated ejection fraction was in the range of 60% to 65%. The    study is not technically sufficient to allow evaluation of LV   diastolic function.  LHC 03/22/17:  The left ventricular systolic function is normal.  LV end diastolic pressure is normal.  The left ventricular ejection fraction is 50-55% by visual estimate.  Ost RCA to Dist RCA lesion, 40 %stenosed.  Mid RCA lesion, 99 %stenosed.  Ost Cx lesion, 50 %stenosed.  Ost 2nd Mrg to 2nd Mrg lesion, 75 %stenosed.  Ost LAD to Prox LAD lesion, 70 %stenosed.     Patient Profile     Mr. Jaroszewski is a 62M with prior polysubstance abuse, diabetes mellitus, hypertension and obesity here with fevers and acute diastolic heart failure  Assessment & Plan    # NSTEMI:  # 3 Vessel CAD:  Mr. Doverspike has 3 vessel disease with 99% RCA the likely culprit for this event.  He has not experienced any chest pain or exertional symptoms. For social reasons he was unable to undergo CABG at this time. Therefore we will plan for PCI of the right coronary artery today. Plan for medical management and CABG after 6-12 months.  Continue aspirin and atorvastatin.  Heparin until cath.   # Hypertension:  Blood pressure well controlled. Hydrochlorothiazide was discontinued and he was started on carvedilol this admission.Continue lisinopril.  # Obesity: Continued weight loss efforts encouraged.   For questions or updates, please contact [facility] Please consult [email or web address] for contact info under Cardiology/STEMI.      Signed, Chilton Si, MD  03/23/2017, 9:14 AM

## 2017-03-23 NOTE — Progress Notes (Signed)
Pt refused to go down for PFT test for CABG workup. Pt states that he told Cardiothoracic Surgeon yesterday evening that he did not want CABG at this time.

## 2017-03-23 NOTE — H&P (View-Only) (Signed)
 Progress Note  Patient Name: George Moreno Date of Encounter: 03/23/2017  Primary Cardiologist: Dr. Ross (new)  Subjective   Feeling well.  Denies chest pain shortness of breath.  Inpatient Medications    Scheduled Meds: . amoxicillin-clavulanate  1 tablet Oral BID  . aspirin  81 mg Oral Daily  . atorvastatin  10 mg Oral QPM  . carvedilol  6.25 mg Oral BID WC  . fenofibrate  160 mg Oral QHS  . insulin aspart  0-15 Units Subcutaneous TID WC  . insulin aspart  0-5 Units Subcutaneous QHS  . insulin glargine  40 Units Subcutaneous QHS  . lisinopril  20 mg Oral QPM  . sodium chloride flush  3 mL Intravenous Q12H   Continuous Infusions: . sodium chloride    . heparin 2,300 Units/hr (03/23/17 0404)   PRN Meds: sodium chloride, acetaminophen, morphine injection, nitroGLYCERIN, ondansetron (ZOFRAN) IV, sodium chloride flush   Vital Signs    Vitals:   03/22/17 2002 03/23/17 0020 03/23/17 0536 03/23/17 0738  BP: 139/73 100/65 138/76 125/72  Pulse:  77 66 66  Resp: 18 20 20 18  Temp: 99.3 F (37.4 C) 98.7 F (37.1 C) 98.2 F (36.8 C) 97.9 F (36.6 C)  TempSrc: Oral Oral Oral Oral  SpO2: 95% 100% 98% 98%  Weight:   107.3 kg (236 lb 8 oz)   Height:        Intake/Output Summary (Last 24 hours) at 03/23/17 0914 Last data filed at 03/23/17 0651  Gross per 24 hour  Intake              720 ml  Output             2420 ml  Net            -1700 ml   Filed Weights   03/21/17 0630 03/22/17 0516 03/23/17 0536  Weight: 109.5 kg (241 lb 4.8 oz) 106.9 kg (235 lb 9.6 oz) 107.3 kg (236 lb 8 oz)    Telemetry    Sinus rhythm. No events - Personally Reviewed  ECG    Sinus rhythm. Rate 69 bpm. - Personally Reviewed  Physical Exam   GEN: Well-appearing.  No acute distress.   Neck: No JVD Cardiac: RRR, no murmurs, rubs, or gallops.  Respiratory: Clear to auscultation bilaterally. GI: Soft, nontender, non-distended  MS: No edema; No deformity. Neuro:  Nonfocal  Psych:  Normal affect   Labs    Chemistry Recent Labs Lab 03/20/17 1115 03/21/17 0521 03/23/17 0248  NA 137 137 137  K 3.8 3.7 4.0  CL 106 102 104  CO2 24 27 26  GLUCOSE 141* 110* 133*  BUN 16 18 21*  CREATININE 1.03 1.07 1.06  CALCIUM 9.1 9.3 9.2  PROT  --   --  6.0*  ALBUMIN  --   --  2.9*  AST  --   --  23  ALT  --   --  25  ALKPHOS  --   --  34*  BILITOT  --   --  0.4  GFRNONAA >60 >60 >60  GFRAA >60 >60 >60  ANIONGAP 7 8 7     Hematology Recent Labs Lab 03/21/17 0521 03/22/17 0410 03/23/17 0248  WBC 10.6* 9.3 8.4  RBC 3.78* 3.88* 3.95*  HGB 11.9* 11.8* 11.9*  HCT 34.8* 35.5* 35.9*  MCV 92.1 91.5 90.9  MCH 31.5 30.4 30.1  MCHC 34.2 33.2 33.1  RDW 13.1 13.0 12.8  PLT 285 375 370      Cardiac Enzymes Recent Labs Lab 03/20/17 1806 03/21/17 0000 03/21/17 0521  TROPONINI 0.34* 0.32* 0.27*    Recent Labs Lab 03/20/17 1122  TROPIPOC 0.16*     BNP Recent Labs Lab 03/20/17 1246  BNP 469.2*     DDimer No results for input(s): DDIMER in the last 168 hours.   Radiology    Dg Chest 2 View  Result Date: 03/23/2017 CLINICAL DATA:  Chest pain. EXAM: CHEST  2 VIEW COMPARISON:  Radiograph of March 20, 2017. FINDINGS: The heart size and mediastinal contours are within normal limits. No pneumothorax is noted. Left lung is clear. Minimal right basilar subsegmental atelectasis is noted with minimal right pleural effusion. The visualized skeletal structures are unremarkable. IMPRESSION: Minimal right basilar subsegmental atelectasis with minimal right pleural effusion. Electronically Signed   By: Lupita Raider, M.D.   On: 03/23/2017 07:41    Cardiac Studies   Echo 03/21/17: Study Conclusions  - Left ventricle: LVEF 60 to 65% with hypokinesis of the inferior   base. Study used Definity. The cavity size was normal. There was   mild concentric hypertrophy. Systolic function was normal. The   estimated ejection fraction was in the range of 60% to 65%. The    study is not technically sufficient to allow evaluation of LV   diastolic function.  LHC 03/22/17:  The left ventricular systolic function is normal.  LV end diastolic pressure is normal.  The left ventricular ejection fraction is 50-55% by visual estimate.  Ost RCA to Dist RCA lesion, 40 %stenosed.  Mid RCA lesion, 99 %stenosed.  Ost Cx lesion, 50 %stenosed.  Ost 2nd Mrg to 2nd Mrg lesion, 75 %stenosed.  Ost LAD to Prox LAD lesion, 70 %stenosed.     Patient Profile     Mr. George Moreno is a 62M with prior polysubstance abuse, diabetes mellitus, hypertension and obesity here with fevers and acute diastolic heart failure  Assessment & Plan    # NSTEMI:  # 3 Vessel CAD:  Mr. George Moreno has 3 vessel disease with 99% RCA the likely culprit for this event.  He has not experienced any chest pain or exertional symptoms. For social reasons he was unable to undergo CABG at this time. Therefore we will plan for PCI of the right coronary artery today. Plan for medical management and CABG after 6-12 months.  Continue aspirin and atorvastatin.  Heparin until cath.   # Hypertension:  Blood pressure well controlled. Hydrochlorothiazide was discontinued and he was started on carvedilol this admission.Continue lisinopril.  # Obesity: Continued weight loss efforts encouraged.   For questions or updates, please contact [facility] Please consult [email or web address] for contact info under Cardiology/STEMI.      Signed, Chilton Si, MD  03/23/2017, 9:14 AM

## 2017-03-24 ENCOUNTER — Encounter (HOSPITAL_COMMUNITY)
Admission: EM | Disposition: A | Discharge status: Home / Self Care | Payer: Self-pay | Source: Home / Self Care | Attending: Internal Medicine

## 2017-03-24 ENCOUNTER — Encounter (HOSPITAL_COMMUNITY): Payer: Self-pay | Admitting: Cardiology

## 2017-03-24 DIAGNOSIS — E7800 Pure hypercholesterolemia, unspecified: Secondary | ICD-10-CM

## 2017-03-24 DIAGNOSIS — Z955 Presence of coronary angioplasty implant and graft: Secondary | ICD-10-CM

## 2017-03-24 LAB — CBC
HCT: 34.2 % — ABNORMAL LOW (ref 39.0–52.0)
HEMOGLOBIN: 11.7 g/dL — AB (ref 13.0–17.0)
MCH: 31 pg (ref 26.0–34.0)
MCHC: 34.2 g/dL (ref 30.0–36.0)
MCV: 90.7 fL (ref 78.0–100.0)
Platelets: 381 10*3/uL (ref 150–400)
RBC: 3.77 MIL/uL — AB (ref 4.22–5.81)
RDW: 13 % (ref 11.5–15.5)
WBC: 8.8 10*3/uL (ref 4.0–10.5)

## 2017-03-24 LAB — GLUCOSE, CAPILLARY: GLUCOSE-CAPILLARY: 73 mg/dL (ref 65–99)

## 2017-03-24 LAB — BASIC METABOLIC PANEL
ANION GAP: 7 (ref 5–15)
BUN: 17 mg/dL (ref 6–20)
CALCIUM: 9 mg/dL (ref 8.9–10.3)
CO2: 24 mmol/L (ref 22–32)
Chloride: 105 mmol/L (ref 101–111)
Creatinine, Ser: 0.98 mg/dL (ref 0.61–1.24)
GFR calc Af Amer: >60 mL/min (ref >60)
Glucose, Bld: 117 mg/dL — ABNORMAL HIGH (ref 65–99)
Potassium: 3.9 mmol/L (ref 3.5–5.1)
Sodium: 136 mmol/L (ref 135–145)

## 2017-03-24 SURGERY — CORONARY ARTERY BYPASS GRAFTING (CABG)
Anesthesia: General | Site: Chest

## 2017-03-24 MED ORDER — TICAGRELOR 90 MG PO TABS: 90.0000 mg | ORAL_TABLET | Freq: Two times a day (BID) | ORAL | 11 refills | Status: DC

## 2017-03-24 MED ORDER — NITROGLYCERIN 0.4 MG SL SUBL: 0.4000 mg | SUBLINGUAL_TABLET | SUBLINGUAL | 12 refills | Status: AC | PRN

## 2017-03-24 MED ORDER — ATORVASTATIN CALCIUM 80 MG PO TABS: 80.0000 mg | ORAL_TABLET | Freq: Every evening | ORAL | 11 refills | Status: DC

## 2017-03-24 MED ORDER — ASPIRIN 81 MG PO CHEW: 81.0000 mg | CHEWABLE_TABLET | Freq: Every day | ORAL | Status: DC

## 2017-03-24 MED ORDER — CARVEDILOL 6.25 MG PO TABS: 6.2500 mg | ORAL_TABLET | Freq: Two times a day (BID) | ORAL | 11 refills | Status: DC

## 2017-03-24 MED ORDER — TICAGRELOR 90 MG PO TABS: 90.0000 mg | ORAL_TABLET | Freq: Two times a day (BID) | ORAL | 0 refills | Status: DC

## 2017-03-24 MED ORDER — LISINOPRIL 20 MG PO TABS: 20.0000 mg | ORAL_TABLET | Freq: Every evening | ORAL | 11 refills | Status: DC

## 2017-03-24 MED FILL — Lidocaine HCl Local Inj 2%: INTRAMUSCULAR | Qty: 10 | Status: AC

## 2017-03-24 NOTE — Progress Notes (Signed)
Brilinta 63m twice a day co-pay $103.37.  No PA required, out of pocket has not been met..[facility].

## 2017-03-24 NOTE — Discharge Summary (Signed)
Discharge Summary    Patient ID: George Moreno,  MRN: [id number], DOB/AGE: April 05, 1971 46 y.o.  Admit date: 03/20/2017 Discharge date: 03/24/2017  Primary Care Provider: Wilfrid Moreno Primary Cardiologist: George George Moreno  Discharge Diagnoses    Principal Problem:   NSTEMI (non-ST elevated myocardial infarction) George Moreno [facility]) Active Problems:   Chest pain   Acute congestive heart failure [facility])   Essential hypertension   Morbid obesity (HCC)   Type II diabetes mellitus (HCC)   Hyperlipidemia   Allergies No Known Allergies  Diagnostic Studies/Procedures    ECHO: 03/21/2017 - Left ventricle: LVEF 60 to 65% with hypokinesis of the inferior   base. Study used Definity. The cavity size was normal. There was   mild concentric hypertrophy. Systolic function was normal. The   estimated ejection fraction was in the range of 60% to 65%. The   study is not technically sufficient to allow evaluation of LV   diastolic function.  CARDIAC CATH: 03/22/2017  The left ventricular systolic function is normal.  LV end diastolic pressure is normal.  The left ventricular ejection fraction is 50-55% by visual estimate.  Ost RCA to Dist RCA lesion, 40 %stenosed.  Mid RCA lesion, 99 %stenosed.  Ost Cx lesion, 50 %stenosed.  Ost 2nd Mrg to 2nd Mrg lesion, 75 %stenosed.  Ost LAD to Prox LAD lesion, 70 %stenosed.   CORONARY STENT INTERVENTION 03/23/2017  SYNERGY DES 2.75X28 drug eluting stent was successfully placed   Mid RCA lesion, 99 %stenosed.  Post intervention, there is a 0% residual stenosis.  A STENT SYNERGY DES 2.75X20 drug eluting stent was successfully placed, and does not overlap previously placed stent.  Dist RCA lesion, 70 %stenosed.  Post intervention, there is a 0% residual stenosis.  A STENT PROMUS PREM MR 3.5X24 drug eluting stent was successfully placed.  Prox LAD lesion, 75 %stenosed.  Post intervention, there is a 0% residual stenosis.  Ost Cx lesion, 50  %stenosed.  Ost 2nd Mrg to 2nd Mrg lesion, 70 %stenosed.  1. Successful stenting of the Mid RCA with DES 2. Successful stenting of the crux of the RCA with DES 3. Successful stenting of the proximal LAD with DES Plan: DAPT for at least one year. Recommend treating residual disease in the LCx medically. Aggressive risk factor modification. Anticipate DC in am Post-Intervention Diagram       _____________   History of Present Illness     George Moreno is a 71M with prior polysubstance abuse, DM, HTN, HLD and obesity. He was admitted 09/22 with fevers and acute diastolic heart failure  Hospital Course     Consultants: George Moreno w/ TCTS   # NSTEMI:  # 3 Vessel CAD:  George Moreno has 3 vessel disease with 99% RCA the likely culprit for this event.  He had not experienced any chest pain or exertional symptoms. For social reasons he was unable to undergo CABG at this time. S/P PCI of the right coronary artery and LAD 09/25. Plan otherwise for medical management, reassess for CABG after 6-12 months.  Continue aspirin, Brilinta, Coreg, lisinopril and atorvastatin (now high-dose).  Rx for SL NTG given as well.   # Hypertension:  Blood pressure mostly well controlled. Hydrochlorothiazide was discontinued and he was started on carvedilol this admission. Continue lisinopril.  He was given a prescription for lisinopril b/c his home dose was a combination tablet.  # Obesity: Continued weight loss efforts encouraged.   # Dyslipidemia: He was on Tricor and Lipitor 20 mg  PTA. Continue Tricor, Lipitor increased to 80 mg qd. Recheck CMET/Lipids in 3 months.  # Recurrent bronchitis: Plan for 7 day treatment with Augmentin. F/u with PCP if sx do not resolve. _____________  Discharge Vitals Blood pressure (!) 143/77, pulse 77, temperature 98 F (36.7 C), temperature source Oral, resp. rate 15, height  (1.702 m), weight 236 lb 8.9 oz (107.3 kg), SpO2 97 %.  Filed Weights   03/23/17 0536 03/23/17 2105  03/24/17 0515  Weight: 236 lb 8 oz (107.3 kg) 236 lb 8.9 oz (107.3 kg) 236 lb 8.9 oz (107.3 kg)  General: Well developed, well nourished, male in no acute distress Head: Eyes PERRLA, No xanthomas.   Normocephalic and atraumatic  Lungs: Clear bilaterally to auscultation. Heart: HRRR S1 S2, without MRG.  Pulses are 2+ & equal. No carotid bruit. No JVD. Abdomen: Bowel sounds are present, abdomen soft and non-tender without masses or  hernias noted. Msk: Normal strength and tone for age. Extremities: No clubbing, cyanosis or edema. 4/4 distal pulses are 2+, R radial cath site without hematoma. Skin:  No rashes or lesions noted. Neuro: Alert and oriented X 3. Psych:  Good affect, responds appropriately   Labs & Radiologic Studies    CBC  Recent Labs  03/23/17 0248 03/24/17 0235  WBC 8.4 8.8  HGB 11.9* 11.7*  HCT 35.9* 34.2*  MCV 90.9 90.7  PLT 370 381   Basic Metabolic Panel  Recent Labs  03/23/17 0248 03/24/17 0235  NA 137 136  K 4.0 3.9  CL 104 105  CO2 26 24  GLUCOSE 133* 117*  BUN 21* 17  CREATININE 1.06 0.98  CALCIUM 9.2 9.0   Liver Function Tests  Recent Labs  03/23/17 0248  AST 23  ALT 25  ALKPHOS 34*  BILITOT 0.4  PROT 6.0*  ALBUMIN 2.9*    Cardiac Enzymes Troponin I  Date Value Ref Range Status  03/21/2017 0.27 (HH) <0.03 ng/mL Final    Comment:    CRITICAL VALUE NOTED.  VALUE IS CONSISTENT WITH PREVIOUSLY REPORTED AND CALLED VALUE.  03/21/2017 0.32 (HH) <0.03 ng/mL Final    Comment:    CRITICAL VALUE NOTED.  VALUE IS CONSISTENT WITH PREVIOUSLY REPORTED AND CALLED VALUE.  03/20/2017 0.34 (HH) <0.03 ng/mL Final    Comment:    CRITICAL RESULT CALLED TO, READ BACK BY AND VERIFIED WITH: J.[email or web address],RN 1954 03/20/17 CLARK,S     BNP    Component Value Date/Time   BNP 469.2 (H) 03/20/2017 1246    Hemoglobin A1C  Recent Labs  03/23/17 0248  HGBA1C 6.3*   Fasting Lipid Panel Lab Results  Component Value Date   CHOL 115 03/21/2017   HDL  28 (L) 03/21/2017   LDLCALC 62 03/21/2017   TRIG 125 03/21/2017   CHOLHDL 4.1 03/21/2017   _____________  Dg Chest 2 View  Result Date: 03/23/2017 CLINICAL DATA:  Chest pain. EXAM: CHEST  2 VIEW COMPARISON:  Radiograph of March 20, 2017. FINDINGS: The heart size and mediastinal contours are within normal limits. No pneumothorax is noted. Left lung is clear. Minimal right basilar subsegmental atelectasis is noted with minimal right pleural effusion. The visualized skeletal structures are unremarkable. IMPRESSION: Minimal right basilar subsegmental atelectasis with minimal right pleural effusion. Electronically Signed   By: Lupita Raider, M.D.   On: 03/23/2017 07:41   Dg Chest Portable 1 View  Result Date: 03/20/2017 CLINICAL DATA:  Shortness of breath and possible CHF. EXAM: PORTABLE CHEST 1 VIEW COMPARISON:  03/20/2017  FINDINGS: AP portable view of the chest was obtained. Slightly low lung volumes. Again noted are prominent central vascular structures. Prominent lung markings at the right lung base. Heart size is within normal limits. Trachea is midline. Negative for a pneumothorax. IMPRESSION: Prominent central vascular structures, particularly in the right lower chest. Findings may be related to low lung volumes and vascular congestion. Electronically Signed   By: Richarda Overlie M.D.   On: 03/20/2017 12:31   Disposition   Pt is being discharged home today in good condition.  Follow-up Plans & Appointments    Follow-up Information    Pricilla Riffle, MD Follow up.   Specialty:  Cardiology Why:  The office will call. Contact information: [address] [place] [address] [phone]          Discharge Instructions    AMB Referral to Cardiac Rehabilitation - Phase II    Complete by:  As directed    Diagnosis:  NSTEMI   AMB Referral to Cardiac Rehabilitation - Phase II    Complete by:  As directed    Diagnosis:  Coronary Stents   Amb Referral to Cardiac  Rehabilitation    Complete by:  As directed    Diagnosis:   Coronary Stents NSTEMI     Diet - low sodium heart healthy    Complete by:  As directed    Diet Carb Modified    Complete by:  As directed    Increase activity slowly    Complete by:  As directed       Discharge Medications   Current Discharge Medication List    START taking these medications   Details  aspirin 81 MG chewable tablet Chew 1 tablet (81 mg total) by mouth daily.    carvedilol (COREG) 6.25 MG tablet Take 1 tablet (6.25 mg total) by mouth 2 (two) times daily with a meal. Qty: 60 tablet, Refills: 11    lisinopril (PRINIVIL,ZESTRIL) 20 MG tablet Take 1 tablet (20 mg total) by mouth every evening. Qty: 30 tablet, Refills: 11    nitroGLYCERIN (NITROSTAT) 0.4 MG SL tablet Place 1 tablet (0.4 mg total) under the tongue every 5 (five) minutes x 3 doses as needed for chest pain. Qty: 25 tablet, Refills: 12    ticagrelor (BRILINTA) 90 MG TABS tablet Take 1 tablet (90 mg total) by mouth 2 (two) times daily. Qty: 60 tablet, Refills: 11      CONTINUE these medications which have CHANGED   Details  atorvastatin (LIPITOR) 80 MG tablet Take 1 tablet (80 mg total) by mouth every evening. Qty: 30 tablet, Refills: 11      CONTINUE these medications which have NOT CHANGED   Details  amoxicillin-clavulanate (AUGMENTIN) 875-125 MG tablet Take 1 tablet by mouth 2 (two) times daily.    fenofibrate (TRICOR) 145 MG tablet Take 145 mg by mouth every evening.    insulin degludec (TRESIBA FLEXTOUCH) 100 UNIT/ML SOPN FlexTouch Pen Inject 40 Units into the skin every evening.    metFORMIN (GLUCOPHAGE) 1000 MG tablet Take 1,000 mg by mouth 2 (two) times daily with a meal.    Multiple Vitamins-Minerals (MULTIVITAMIN PO) Take 1 tablet by mouth daily.      STOP taking these medications     ibuprofen (ADVIL,MOTRIN) 800 MG tablet      lisinopril-hydrochlorothiazide (PRINZIDE,ZESTORETIC) 20-12.5 MG tablet           Aspirin prescribed at discharge?  Yes High Intensity Statin Prescribed? (Lipitor 40-80mg  or  Crestor 20-40mg ): Yes Beta Blocker Prescribed? Yes For EF <40%, was ACEI/ARB Prescribed? Yes ADP Receptor Inhibitor Prescribed? (i.e. Plavix etc.-Includes Medically Managed Patients): Yes For EF <40%, Aldosterone Inhibitor Prescribed? Yes Was EF assessed during THIS hospitalization? Yes Was Cardiac Rehab II ordered? (Included Medically managed Patients): Yes   Outstanding Labs/Studies   none  Duration of Discharge Encounter   Greater than 30 minutes including physician time.  Melida Quitter NP 03/24/2017, 11:02 AM

## 2017-03-24 NOTE — Progress Notes (Signed)
TR BAND REMOVAL  LOCATION:    right radial  DEFLATED PER PROTOCOL:    Yes.    TIME BAND OFF / DRESSING APPLIED:    0000   SITE UPON ARRIVAL:    Level 0  SITE AFTER BAND REMOVAL:    Level 0  CIRCULATION SENSATION AND MOVEMENT:    Within Normal Limits   Yes.    COMMENTS:   Radial site teaching reinforced. Pt verbalizes and demonstrates understanding.

## 2017-03-24 NOTE — Care Management Note (Addendum)
Case Management Note  Patient Details  Name: George Moreno MRN: [id number] Date of Birth: Dec 19, 1970  Subjective/Objective:   From home, pta indep, s/p coronary stent intervention, will be on brilinta, co pay is 103.37 per benefit check.  NCM gave him the $5 copay savings card.  NCM informed patient that he may just have to pay $5 or it may be the 103.37.   NCM informed patient , that if this is too high to let his MD know on follow up apt and also his Cardiologist has samples in the office. He states he understands.   N[facility] [address], he states he would like to go to [facility] [address], they do have in stock.                     Action/Plan: DC to home today.   Expected Discharge Date:  03/23/17               Expected Discharge Plan:  Home/Self Care  In-House Referral:     Discharge planning Services  CM Consult  Post Acute Care Choice:    Choice offered to:     DME Arranged:    DME Agency:     HH Arranged:    HH Agency:     Status of Service:  Completed, signed off  If discussed at [employer] of Stay Meetings, dates discussed:    Additional Comments:  Leone Haven, RN 03/24/2017, 9:47 AM

## 2017-03-24 NOTE — Progress Notes (Signed)
Progress Note  Patient Name: George Moreno Date of Encounter: 03/24/2017  Primary Cardiologist: Dr. Tenny Craw (new)   Subjective   No chest pain.  Shortness of breath has improved.  Inpatient Medications    Scheduled Meds: . amoxicillin-clavulanate  1 tablet Oral BID  . aspirin  81 mg Oral Daily  . atorvastatin  80 mg Oral QPM  . carvedilol  6.25 mg Oral BID WC  . enoxaparin (LOVENOX) injection  40 mg Subcutaneous Q24H  . fenofibrate  160 mg Oral QHS  . insulin aspart  0-15 Units Subcutaneous TID WC  . insulin aspart  0-5 Units Subcutaneous QHS  . insulin glargine  40 Units Subcutaneous QHS  . lisinopril  20 mg Oral QPM  . sodium chloride flush  3 mL Intravenous Q12H  . sodium chloride flush  3 mL Intravenous Q12H  . ticagrelor  90 mg Oral BID   Continuous Infusions: . sodium chloride     PRN Meds: sodium chloride, acetaminophen, nitroGLYCERIN, ondansetron (ZOFRAN) IV, sodium chloride flush   Vital Signs    Vitals:   03/23/17 1935 03/23/17 2105 03/24/17 0515 03/24/17 0800  BP: (!) 123/55 (!) 117/47 (!) 143/77   Pulse: 75 74 70 77  Resp: Temp:  98.5 F (36.9 C) 97.9 F (36.6 C) 98 F (36.7 C)  TempSrc:  Oral Oral Oral  SpO2: 97% 96% 98% 97%  Weight:  107.3 kg (236 lb 8.9 oz) 107.3 kg (236 lb 8.9 oz)   Height:   (1.702 m)      Intake/Output Summary (Last 24 hours) at 03/24/17 0956 Last data filed at 03/24/17 0921  Gross per 24 hour  Intake          1938.04 ml  Output             1825 ml  Net           113.04 ml   Filed Weights   03/23/17 0536 03/23/17 2105 03/24/17 0515  Weight: 107.3 kg (236 lb 8 oz) 107.3 kg (236 lb 8.9 oz) 107.3 kg (236 lb 8.9 oz)    Telemetry    Sinus rhythm. No events - Personally Reviewed  ECG    Sinus rhythm. Rate 64 bpm. - Personally Reviewed  Physical Exam   GEN: Well-appearing.  No acute distress.   Neck: No JVD Cardiac: RRR, no murmurs, rubs, or gallops.  Respiratory: Clear to auscultation  bilaterally. GI: Soft, nontender, non-distended  MS: No edema; No deformity. R radial without significant ecchymosis.  Neuro:  Nonfocal  Psych: Normal affect   Labs    Chemistry  Recent Labs Lab 03/21/17 0521 03/23/17 0248 03/24/17 0235  NA 137 137 136  K 3.7 4.0 3.9  CL 102 104 105  CO2 GLUCOSE 110* 133* 117*  BUN 18 21* 17  CREATININE 1.07 1.06 0.98  CALCIUM 9.3 9.2 9.0  PROT  --  6.0*  --   ALBUMIN  --  2.9*  --   AST  --  23  --   ALT  --  25  --   ALKPHOS  --  34*  --   BILITOT  --  0.4  --   GFRNONAA >60 >60 >60  GFRAA >60 >60 >60  ANIONGAP Hematology  Recent Labs Lab 03/22/17 0410 03/23/17 0248 03/24/17 0235  WBC 9.3 8.4 8.8  RBC 3.88* 3.95* 3.77*  HGB 11.8* 11.9*  11.7*  HCT 35.5* 35.9* 34.2*  MCV 91.5 90.9 90.7  MCH 30.4 30.1 31.0  MCHC 33.2 33.1 34.2  RDW 13.0 12.8 13.0  PLT 375 370 381    Cardiac Enzymes  Recent Labs Lab 03/20/17 1806 03/21/17 0000 03/21/17 0521  TROPONINI 0.34* 0.32* 0.27*     Recent Labs Lab 03/20/17 1122  TROPIPOC 0.16*     BNP  Recent Labs Lab 03/20/17 1246  BNP 469.2*     DDimer No results for input(s): DDIMER in the last 168 hours.   Radiology    Dg Chest 2 View  Result Date: 03/23/2017 CLINICAL DATA:  Chest pain. EXAM: CHEST  2 VIEW COMPARISON:  Radiograph of March 20, 2017. FINDINGS: The heart size and mediastinal contours are within normal limits. No pneumothorax is noted. Left lung is clear. Minimal right basilar subsegmental atelectasis is noted with minimal right pleural effusion. The visualized skeletal structures are unremarkable. IMPRESSION: Minimal right basilar subsegmental atelectasis with minimal right pleural effusion. Electronically Signed   By: Lupita Raider, M.D.   On: 03/23/2017 07:41    Cardiac Studies   Echo 03/21/17: Study Conclusions  - Left ventricle: LVEF 60 to 65% with hypokinesis of the inferior   base. Study used Definity. The cavity size was  normal. There was   mild concentric hypertrophy. Systolic function was normal. The   estimated ejection fraction was in the range of 60% to 65%. The   study is not technically sufficient to allow evaluation of LV   diastolic function.  LHC 03/22/17:  The left ventricular systolic function is normal.  LV end diastolic pressure is normal.  The left ventricular ejection fraction is 50-55% by visual estimate.  Ost RCA to Dist RCA lesion, 40 %stenosed.  Mid RCA lesion, 99 %stenosed.  Ost Cx lesion, 50 %stenosed.  Ost 2nd Mrg to 2nd Mrg lesion, 75 %stenosed.  Ost LAD to Prox LAD lesion, 70 %stenosed.     Patient Profile     Mr. George Moreno is a 17M with prior polysubstance abuse, diabetes mellitus, hypertension and obesity here with fevers and acute diastolic heart failure  Assessment & Plan    # NSTEMI:  # 3 Vessel CAD:  Mr. George Moreno has 3 vessel disease with 99% RCA the likely culprit for this event.  He has not experienced any chest pain or exertional symptoms. For social reasons he was unable to undergo CABG at this time. Therefore we will plan for PCI of the right coronary artery today. Plan for medical management and CABG after 6-12 months.  Continue aspirin and atorvastatin.  Heparin until cath.   # Hypertension:  Blood pressure mostly well controlled. Hydrochlorothiazide was discontinued and he was started on carvedilol this admission. Continue lisinopril.  He will need a prescription for lisinopril b/c his home dose was a combination tablet.  # Obesity: Continued weight loss efforts encouraged.   # Recurrent bronchitis: Plan for 7 day treatment with Augmentin.   For questions or updates, please contact [facility] Please consult [email or web address] for contact info under Cardiology/STEMI.      Signed, Chilton Si, MD  03/24/2017, 9:56 AM

## 2017-03-24 NOTE — Progress Notes (Signed)
CARDIAC REHAB PHASE I   PRE:  Rate/Rhythm: 81 SR  BP:  Sitting: 126/79   MODE:  Ambulation: 500 ft   POST:  Rate/Rhythm: 94 SR  BP:  Sitting: 155/82       Pt ambulated 500 ft on RA, independent, steady gait, tolerated well with no complaints. Completed MI/stent education.  Reviewed risk factors, MI/PCI book, anti-platelet therapy, stent card, activity restrictions, ntg, exercise, heart healthy and diabetes diet handouts and phase 2 cardiac rehab. Pt verbalized understanding. Pt agrees to phase 2 cardiac rehab referral, will send to [facility] per pt request. Pt to edge of bed per pt request after walk, call bell within reach.    [phone] Joylene Grapes, RN, BSN 03/24/2017 10:25 AM

## 2017-03-25 ENCOUNTER — Telehealth: Payer: Self-pay | Admitting: Internal Medicine

## 2017-03-25 NOTE — Telephone Encounter (Signed)
Patient contacted regarding discharge from [facility] on 03/24/17.  Patient understands to follow up with provider Tereso Newcomer, PA-c on 04/05/17 at 8:45 at [address] [facility]. Patient understands discharge instructions? Yes Patient understands medications and regiment? Yes Patient understands to bring all medications to this visit? Yes  The pt is requesting that I mail him a letter with our address, his appt date and time as he was napping when I called him and he had no pencil or paper available.  I have placed letter in out going mail bin.

## 2017-03-25 NOTE — Telephone Encounter (Signed)
TOC Phone call  Appt is on 04/05/17 at 8:45am w/ Tereso Newcomer

## 2017-03-26 ENCOUNTER — Telehealth (HOSPITAL_COMMUNITY): Payer: Self-pay

## 2017-03-26 NOTE — Telephone Encounter (Signed)
Patient insurance is active and benefits verified. Patient insurance is [employer] - no co-payment, deductible $1250/$1250 has been met, out of pocket $3000/$302.80 has been met, 20% co-insurance and no pre-authorization. Passport/reference [id number]. Jarrett Soho verified insurance.   Patient will be contacted and scheduled after their follow up appointment with the cardiologist on 04/05/17, upon review by [facility] RN navigator.

## 2017-04-04 NOTE — Progress Notes (Signed)
Cardiology Office Note:    Date:  04/05/2017   ID:  George Moreno, DOB 1971-04-11, MRN [id number]  PCP:  Wilfrid Lund, PA  Cardiologist:  Dr. Dietrich Pates    Referring MD: Wilfrid Lund, [place]   Chief Complaint  Patient presents with  . Hospitalization Follow-up    NSTEMI s/p PCI    History of Present Illness:    George Moreno is a 46 y.o. male with a hx of diabetes, hypertension, hyperlipidemia.    Admitted 9/22-9/26 with fevers and diastolic congestive heart failure in the setting of a non ST elevation myocardial infarction.  LHC demonstrated 3 V CAD.  Patient declined coronary artery bypass grafting and underwent PCI with DES to the mid RCA, DES to the distal RCA and DES to the proximal LAD.  CABG could be considered after 6-12 months.  Post PCI course was uneventful.    Mr. Gloss returns for follow up.  He is doing well.  He has not had any chest pain, shortness of breath.  He denies paroxysmal nocturnal dyspnea, edema, syncope.  He gets dizzy after taking medications in the morning if he has not eaten.  The dizziness resolves if he eats.  He denies bloody stools or bloody urine. He is still using smokeless tobacco but is trying to quit.   Prior CV studies:   The following studies were reviewed today:  Cardiac Catheterization 03/23/17 LAD prox 75 LCx ost 50, OM2 70 RCA mid 99, dist 70 PCI: 3.5 x 24 mm Promus Prem DES to pLAD PCI: 2.75 x 28 mm Synergy DES to [facility] PCI: 2.25 x 15 mm Synergy DES to dRCA       Cardiac Catheterization 03/22/17 LAD ost 70 LCx ost 50, OM2 75 RCA ost 40, mid 99 thrombotic EF 50-55  Echocardiogram 03/21/17 EF 60-65, inf HK, mild conc LVH   Past Medical History:  Diagnosis Date  . Acute congestive heart failure (HCC)   . Chest pain 03/20/2017  . Diabetes mellitus without complication (HCC)   . Essential hypertension   . Hyperlipidemia   . Hypertension   . Morbid obesity (HCC)   . NSTEMI (non-ST elevated myocardial infarction) (HCC)   .  Type II diabetes mellitus (HCC)     Past Surgical History:  Procedure Laterality Date  . CORONARY STENT INTERVENTION N/A 03/23/2017   Procedure: CORONARY STENT INTERVENTION;  Surgeon: Swaziland, Peter M, MD;  Location: [facility] INVASIVE CV LAB;  Service: Cardiovascular;  Laterality: N/A;  . LEFT HEART CATH AND CORONARY ANGIOGRAPHY N/A 03/22/2017   Procedure: LEFT HEART CATH AND CORONARY ANGIOGRAPHY;  Surgeon: Runell Gess, MD;  Location: [facility] INVASIVE CV LAB;  Service: Cardiovascular;  Laterality: N/A;    Current Medications: Current Meds  Medication Sig  . aspirin 81 MG chewable tablet Chew 1 tablet (81 mg total) by mouth daily.  Marland Kitchen atorvastatin (LIPITOR) 80 MG tablet Take 1 tablet (80 mg total) by mouth every evening.  . carvedilol (COREG) 6.25 MG tablet Take 1 tablet (6.25 mg total) by mouth 2 (two) times daily with a meal.  . fenofibrate (TRICOR) 145 MG tablet Take 145 mg by mouth every evening.  . insulin degludec (TRESIBA FLEXTOUCH) 100 UNIT/ML SOPN FlexTouch Pen Inject 40 Units into the skin every evening.  Marland Kitchen lisinopril (PRINIVIL,ZESTRIL) 20 MG tablet Take 1 tablet (20 mg total) by mouth every evening.  . metFORMIN (GLUCOPHAGE) 1000 MG tablet Take 1,000 mg by mouth 2 (two) times daily with a meal.  . Multiple  Vitamins-Minerals (MULTIVITAMIN PO) Take 1 tablet by mouth daily.  . nitroGLYCERIN (NITROSTAT) 0.4 MG SL tablet Place 1 tablet (0.4 mg total) under the tongue every 5 (five) minutes x 3 doses as needed for chest pain.  . ticagrelor (BRILINTA) 90 MG TABS tablet Take 1 tablet (90 mg total) by mouth 2 (two) times daily.     Allergies:   Patient has no known allergies.   Social History   Social History  . Marital status: Single    Spouse name: N/A  . Number of children: N/A  . Years of education: N/A   Occupational History  . manager [employer]   Social History Main Topics  . Smoking status: Never Smoker  . Smokeless tobacco: Never Used  . Alcohol use Yes     Comment: rare    . Drug use: Unknown  . Sexual activity: Not Asked   Other Topics Concern  . None   Social History Narrative  . None     Family Hx: The patient's family history is not on file.  ROS:   Please see the history of present illness.    ROS All other systems reviewed and are negative.   EKGs/Labs/Other Test Reviewed:    EKG:  EKG is  ordered today.  The ekg ordered today demonstrates Normal sinus rhythm, HR 79, normal axis, inferior Q waves with T-wave inversions 3, aVF, QTc 412 ms  Recent Labs: 03/20/2017: B Natriuretic Peptide 469.2 03/21/2017: Magnesium 1.8 03/23/2017: ALT 25 03/24/2017: BUN 17; Creatinine, Ser 0.98; Hemoglobin 11.7; Platelets 381; Potassium 3.9; Sodium 136   Recent Lipid Panel Lab Results  Component Value Date/Time   CHOL 115 03/21/2017 12:00 AM   TRIG 125 03/21/2017 12:00 AM   HDL 28 (L) 03/21/2017 12:00 AM   CHOLHDL 4.1 03/21/2017 12:00 AM   LDLCALC 62 03/21/2017 12:00 AM    Physical Exam:    VS:  BP 136/64   Pulse 78   Ht  (1.702 m)   Wt 247 lb (112 kg)   BMI 38.69 kg/m     Wt Readings from Last 3 Encounters:  04/05/17 247 lb (112 kg)  03/24/17 236 lb 8.9 oz (107.3 kg)     Physical Exam  Constitutional: He is oriented to person, place, and time. He appears well-developed and well-nourished. No distress.  HENT:  Head: Normocephalic and atraumatic.  Neck: No JVD present.  Cardiovascular: Normal rate and regular rhythm.   No murmur heard. Pulmonary/Chest: Effort normal. He has no rales.  Abdominal: Soft.  Musculoskeletal: He exhibits no edema or deformity (R wrist without hematoma).  Neurological: He is alert and oriented to person, place, and time.  Skin: Skin is warm and dry.  Psychiatric: He has a normal mood and affect.    ASSESSMENT:    1. NSTEMI (non-ST elevated myocardial infarction) (HCC)   2. Essential hypertension   3. Pure hypercholesterolemia   4. Type 2 diabetes mellitus with complication, without long-term current  use of insulin (HCC)    PLAN:    In order of problems listed above:  1.NSTEMI (non-ST elevated myocardial infarction) [facility]) Recent admission with non-ST elevation myocardial infarction c/b acute diastolic congestive heart failure.  He was noted to have 3v CAD and coronary artery bypass grafting was recommended.  But, he was not able to do this due to financial issues and opted for multivessel PCI.  He is s/p DES to the LAD and DES x 2 to the RCA.  His EF is preserved.  He is doing well without chest pain, shortness of breath.  Continue ASA, Ticagrelor, beta-blocker, high intensity statin, ACE inhibitor.  He has not yet heard from cardiac rehabilitation.  I have encouraged him to do this if he can.  He is trying to quit smokeless tobacco.  2. Essential hypertension The patient's blood pressure is controlled on his current regimen.  Continue current therapy.    3. Pure hypercholesterolemia Continue high intensity statin.  Check Lipids and LFTs in 6 weeks.    4. Type 2 diabetes mellitus with complication, without long-term current use of insulin (HCC) Continue follow up with PCP.   Dispo:  Return in about 3 months (around 07/06/2017) for Routine Follow Up, w/ Dr. Tenny Craw.   Medication Adjustments/Labs and Tests Ordered: Current medicines are reviewed at length with the patient today.  Concerns regarding medicines are outlined above.  Tests Ordered: Orders Placed This Encounter  Procedures  . Lipid Profile  . Hepatic function panel  . EKG 12-Lead   Medication Changes: No orders of the defined types were placed in this encounter.   Signed, Tereso Newcomer, PA-C  04/05/2017 9:15 AM    [facility] [address] [place], [place], [place]  [address] Phone: [phone]; Fax: [phone]

## 2017-04-05 ENCOUNTER — Ambulatory Visit (INDEPENDENT_AMBULATORY_CARE_PROVIDER_SITE_OTHER): Payer: BLUE CROSS/BLUE SHIELD | Admitting: Physician Assistant

## 2017-04-05 ENCOUNTER — Encounter: Payer: Self-pay | Admitting: Physician Assistant

## 2017-04-05 VITALS — BP 136/64 | HR 78 | Ht 67.0 in | Wt 247.0 lb

## 2017-04-05 DIAGNOSIS — I214 Non-ST elevation (NSTEMI) myocardial infarction: Secondary | ICD-10-CM

## 2017-04-05 DIAGNOSIS — E118 Type 2 diabetes mellitus with unspecified complications: Secondary | ICD-10-CM | POA: Diagnosis not present

## 2017-04-05 DIAGNOSIS — E7800 Pure hypercholesterolemia, unspecified: Secondary | ICD-10-CM | POA: Diagnosis not present

## 2017-04-05 DIAGNOSIS — I10 Essential (primary) hypertension: Secondary | ICD-10-CM | POA: Diagnosis not present

## 2017-04-05 NOTE — Patient Instructions (Addendum)
Medication Instructions:  1. Your physician recommends that you continue on your current medications as directed. Please refer to the Current Medication list given to you today..   Labwork: 05/17/17 FASTING LIPID AND LIVER PANEL; NOTHING TO EAT AFTER MIDINIGHT, YOU MAY TAKE YOUR MORNING MEDICATIONS THAT MORNING WITH WATER ; LAB OPENS AT 7:30 AM  Testing/Procedures: NONE ORDERED TODAY  Follow-Up: 07/08/17 @ 8 AM WITH DR. ROSS  Any Other Special Instructions Will Be Listed Below (If Applicable).     If you need a refill on your cardiac medications before your next appointment, please call your pharmacy.

## 2017-04-07 ENCOUNTER — Telehealth (HOSPITAL_COMMUNITY): Payer: Self-pay

## 2017-04-07 NOTE — Telephone Encounter (Signed)
I called and spoke to patient about scheduling for cardiac rehab. Patient declined. Referral closed.  ° °

## 2017-04-14 DIAGNOSIS — E113411 Type 2 diabetes mellitus with severe nonproliferative diabetic retinopathy with macular edema, right eye: Secondary | ICD-10-CM | POA: Diagnosis not present

## 2017-04-16 DIAGNOSIS — H2512 Age-related nuclear cataract, left eye: Secondary | ICD-10-CM | POA: Diagnosis not present

## 2017-04-16 DIAGNOSIS — H2513 Age-related nuclear cataract, bilateral: Secondary | ICD-10-CM | POA: Diagnosis not present

## 2017-04-16 DIAGNOSIS — I10 Essential (primary) hypertension: Secondary | ICD-10-CM | POA: Diagnosis not present

## 2017-04-16 DIAGNOSIS — E113411 Type 2 diabetes mellitus with severe nonproliferative diabetic retinopathy with macular edema, right eye: Secondary | ICD-10-CM | POA: Diagnosis not present

## 2017-04-16 DIAGNOSIS — E119 Type 2 diabetes mellitus without complications: Secondary | ICD-10-CM | POA: Diagnosis not present

## 2017-04-19 ENCOUNTER — Telehealth: Payer: Self-pay

## 2017-04-19 NOTE — Telephone Encounter (Signed)
    [facility] Pre-operative Risk Assessment    Request for surgical clearance:  1. What type of surgery is being performed? Cataract wxtraction w/ intraocular lens implantation of the LEFT eye followed by the RIGHT eye   2. When is this surgery scheduled? TBD   3. Are there any medications that need to be held prior to surgery and how long?   4. Practice name and name of physician performing surgery? [facility]    5. What is your office phone and fax number? Ph [phone]  FAX  [phone]    6. Anesthesia type (None, local, MAC, general) ? Topical   Tod Persia 04/19/2017, 2:36 PM  _________________________________________________________________   (provider comments below)

## 2017-04-19 NOTE — Telephone Encounter (Signed)
Faxed via [facility]

## 2017-04-19 NOTE — Telephone Encounter (Signed)
   Chart reviewed as part of pre-operative protocol coverage. Given past medical history and time since last visit, based on ACC/AHA guidelines, George Moreno would be at acceptable risk for the planned procedure without further cardiovascular testing.   However, patient received DES x 3 on 03/23/17 and can NOT stop ASA or brilinta for at least one year.  Roe RutherfordAngela Nicole Duke, PA 04/19/2017, 2:58 PM

## 2017-05-13 DIAGNOSIS — H2512 Age-related nuclear cataract, left eye: Secondary | ICD-10-CM | POA: Diagnosis not present

## 2017-05-13 DIAGNOSIS — H2513 Age-related nuclear cataract, bilateral: Secondary | ICD-10-CM | POA: Diagnosis not present

## 2017-05-14 DIAGNOSIS — H2511 Age-related nuclear cataract, right eye: Secondary | ICD-10-CM | POA: Diagnosis not present

## 2017-05-27 ENCOUNTER — Other Ambulatory Visit: Payer: Self-pay | Admitting: *Deleted

## 2017-05-27 MED ORDER — LISINOPRIL 20 MG PO TABS: 20.0000 mg | ORAL_TABLET | Freq: Every evening | ORAL | 3 refills | Status: DC

## 2017-05-27 MED ORDER — CARVEDILOL 6.25 MG PO TABS: 6.2500 mg | ORAL_TABLET | Freq: Two times a day (BID) | ORAL | 3 refills | Status: DC

## 2017-05-27 MED ORDER — ATORVASTATIN CALCIUM 80 MG PO TABS: 80.0000 mg | ORAL_TABLET | Freq: Every evening | ORAL | 3 refills | Status: DC

## 2017-05-27 MED ORDER — TICAGRELOR 90 MG PO TABS: 90.0000 mg | ORAL_TABLET | Freq: Two times a day (BID) | ORAL | 3 refills | Status: DC

## 2017-05-27 NOTE — Telephone Encounter (Signed)
Called Patient to verify [facility] request.  Patient uses both [facility] for local or immediate pick up and [facility] for long term delivery. .Marland Kitchen

## 2017-06-03 DIAGNOSIS — I10 Essential (primary) hypertension: Secondary | ICD-10-CM | POA: Diagnosis not present

## 2017-06-03 DIAGNOSIS — E782 Mixed hyperlipidemia: Secondary | ICD-10-CM | POA: Diagnosis not present

## 2017-06-03 DIAGNOSIS — Z7984 Long term (current) use of oral hypoglycemic drugs: Secondary | ICD-10-CM | POA: Diagnosis not present

## 2017-06-03 DIAGNOSIS — E1129 Type 2 diabetes mellitus with other diabetic kidney complication: Secondary | ICD-10-CM | POA: Diagnosis not present

## 2017-06-10 DIAGNOSIS — H2511 Age-related nuclear cataract, right eye: Secondary | ICD-10-CM | POA: Diagnosis not present

## 2017-06-17 DIAGNOSIS — H2513 Age-related nuclear cataract, bilateral: Secondary | ICD-10-CM | POA: Diagnosis not present

## 2017-07-08 ENCOUNTER — Ambulatory Visit (INDEPENDENT_AMBULATORY_CARE_PROVIDER_SITE_OTHER): Payer: BLUE CROSS/BLUE SHIELD | Admitting: Internal Medicine

## 2017-07-08 ENCOUNTER — Encounter: Payer: Self-pay | Admitting: Internal Medicine

## 2017-07-08 VITALS — BP 122/84 | HR 90 | Ht 67.0 in | Wt 245.0 lb

## 2017-07-08 DIAGNOSIS — I10 Essential (primary) hypertension: Secondary | ICD-10-CM | POA: Diagnosis not present

## 2017-07-08 DIAGNOSIS — I2510 Atherosclerotic heart disease of native coronary artery without angina pectoris: Secondary | ICD-10-CM

## 2017-07-08 DIAGNOSIS — E7800 Pure hypercholesterolemia, unspecified: Secondary | ICD-10-CM | POA: Diagnosis not present

## 2017-07-08 LAB — BASIC METABOLIC PANEL
BUN/Creatinine Ratio: 13 (ref 9–20)
BUN: 14 mg/dL (ref 6–24)
CALCIUM: 9.6 mg/dL (ref 8.7–10.2)
CHLORIDE: 105 mmol/L (ref 96–106)
CO2: 24 mmol/L (ref 20–29)
Creatinine, Ser: 1.05 mg/dL (ref 0.76–1.27)
GFR calc non Af Amer: 85 mL/min/{1.73_m2} (ref >59)
GFR, EST AFRICAN AMERICAN: 98 mL/min/{1.73_m2} (ref >59)
Glucose: 149 mg/dL — ABNORMAL HIGH (ref 65–99)
POTASSIUM: 4.7 mmol/L (ref 3.5–5.2)
SODIUM: 142 mmol/L (ref 134–144)

## 2017-07-08 LAB — LIPID PANEL
Chol/HDL Ratio: 4 ratio (ref 0.0–5.0)
Cholesterol, Total: 88 mg/dL — ABNORMAL LOW (ref 100–199)
HDL: 22 mg/dL — AB (ref >39)
LDL Calculated: 50 mg/dL (ref 0–99)
Triglycerides: 81 mg/dL (ref 0–149)
VLDL Cholesterol Cal: 16 mg/dL (ref 5–40)

## 2017-07-08 LAB — CBC
Hematocrit: 37.4 % — ABNORMAL LOW (ref 37.5–51.0)
Hemoglobin: 12.4 g/dL — ABNORMAL LOW (ref 13.0–17.7)
MCH: 30.5 pg (ref 26.6–33.0)
MCHC: 33.2 g/dL (ref 31.5–35.7)
MCV: 92 fL (ref 79–97)
PLATELETS: 411 10*3/uL — AB (ref 150–379)
RBC: 4.06 x10E6/uL — ABNORMAL LOW (ref 4.14–5.80)
RDW: 13.7 % (ref 12.3–15.4)
WBC: 10.6 10*3/uL (ref 3.4–10.8)

## 2017-07-08 NOTE — Patient Instructions (Signed)
Your physician recommends that you continue on your current medications as directed. Please refer to the Current Medication list given to you today. Your physician recommends that you return for lab work today (BMET, CBC, LIPID) Your physician wants you to follow-up in: 6 MONTHS WITH DR. Tenny CrawOSS.  You will receive a reminder letter in the mail two months in advance. If you don't receive a letter, please call our office to schedule the follow-up appointment.

## 2017-07-08 NOTE — Progress Notes (Signed)
Cardiology Office Note:    Date:  07/08/2017   ID:  George LopesAnthony Moreno, DOB 02/04/1971, MRN [id number]  PCP:  Wilfrid LundBecker, Anna G, PA  Cardiologist:  Dr. Dietrich PatesPaula Ross    Referring MD: Wilfrid LundBecker, Anna G, PA   Pt presents for f/u of CAD    History of Present Illness:    George Moreno is a 47 y.o. male with a hx of diabetes, hypertension, hyperlipidemia.    Admitted 9/22-9/26 with fevers and diastolic congestive heart failure in the setting of a non ST elevation myocardial infarction.  LHC demonstrated 3 V CAD.  Patient declined coronary artery bypass grafting and underwent PCI with DES to the mid RCA, DES to the distal RCA and DES to the proximal LAD.  CABG could be considered after 6-12 months.  Post PCI course was uneventful.    SInce patient was seen last he has done well  Breathing is OK   NO Chest pain  Remains active  He is recovering from an upper resp infection.    Prior CV studies:   The following studies were reviewed today:  Cardiac Catheterization 03/23/17 LAD prox 75 LCx ost 50, OM2 70 RCA mid 99, dist 70 PCI: 3.5 x 24 mm Promus Prem DES to pLAD PCI: 2.75 x 28 [facility]: 2.25 x 15 mm Synergy DES to dRCA       Cardiac Catheterization 03/22/17 LAD ost 70 LCx ost 50, OM2 75 RCA ost 40, mid 99 thrombotic EF 50-55  Echocardiogram 03/21/17 EF 60-65, inf HK, mild conc LVH   Past Medical History:  Diagnosis Date  . Acute congestive heart failure (HCC)   . Chest pain 03/20/2017  . Diabetes mellitus without complication (HCC)   . Essential hypertension   . Hyperlipidemia   . Hypertension   . Morbid obesity (HCC)   . NSTEMI (non-ST elevated myocardial infarction) (HCC)   . Type II diabetes mellitus (HCC)     Past Surgical History:  Procedure Laterality Date  . CORONARY STENT INTERVENTION N/A 03/23/2017   Procedure: CORONARY STENT INTERVENTION;  Surgeon: SwazilandJordan, Peter M, MD;  [facility];  Service: Cardiovascular;  Laterality: N/A;  . LEFT HEART  CATH AND CORONARY ANGIOGRAPHY N/A 03/22/2017   Procedure: LEFT HEART CATH AND CORONARY ANGIOGRAPHY;  Surgeon: Runell GessBerry, Jonathan J, MD;  Location: [facility] INVASIVE CV LAB;  Service: Cardiovascular;  Laterality: N/A;    Current Medications: Current Meds  Medication Sig  . aspirin EC 81 MG tablet Take 81 mg by mouth daily.  Marland Kitchen. atorvastatin (LIPITOR) 80 MG tablet Take 1 tablet (80 mg total) by mouth every evening.  . carvedilol (COREG) 6.25 MG tablet Take 1 tablet (6.25 mg total) by mouth 2 (two) times daily with a meal.  . fenofibrate (TRICOR) 145 MG tablet Take 145 mg by mouth every evening.  . insulin degludec (TRESIBA FLEXTOUCH) 100 UNIT/ML SOPN FlexTouch Pen Inject 40 Units into the skin every evening.  Marland Kitchen. lisinopril (PRINIVIL,ZESTRIL) 20 MG tablet Take 1 tablet (20 mg total) by mouth every evening.  . metFORMIN (GLUCOPHAGE) 1000 MG tablet Take 1,000 mg by mouth 2 (two) times daily with a meal.  . Multiple Vitamins-Minerals (MULTIVITAMIN PO) Take 1 tablet by mouth daily.  . nitroGLYCERIN (NITROSTAT) 0.4 MG SL tablet Place 1 tablet (0.4 mg total) under the tongue every 5 (five) minutes x 3 doses as needed for chest pain.  . ticagrelor (BRILINTA) 90 MG TABS tablet Take 1 tablet (90 mg total) by  mouth 2 (two) times daily.     Allergies:   Patient has no known allergies.   Social History   Socioeconomic History  . Marital status: Single    Spouse name: None  . Number of children: None  . Years of education: None  . Highest education level: None  Social Needs  . Financial resource strain: None  . Food insecurity - worry: None  . Food insecurity - inability: None  . Transportation needs - medical: None  . Transportation needs - non-medical: None  Occupational History  . Occupation: Event organiser: [employer]  Tobacco Use  . Smoking status: Never Smoker  . Smokeless tobacco: Never Used  Substance and Sexual Activity  . Alcohol use: Yes    Comment: rare  . Drug use: None  . Sexual  activity: None  Other Topics Concern  . None  Social History Narrative  . None     Family Hx: The patient's family history is not on file.  ROS:   Please see the history of present illness.    ROS All other systems reviewed and are negative.   EKGs/Labs/Other Test Reviewed:    EKG:  EKG is  ordered today.  The ekg ordered today demonstrates Normal sinus rhythm, HR 79, normal axis, inferior Q waves with T-wave inversions 3, aVF, QTc 412 ms  Recent Labs: 03/20/2017: B Natriuretic Peptide 469.2 03/21/2017: Magnesium 1.8 03/23/2017: ALT 25 03/24/2017: BUN 17; Creatinine, Ser 0.98; Hemoglobin 11.7; Platelets 381; Potassium 3.9; Sodium 136   Recent Lipid Panel Lab Results  Component Value Date/Time   CHOL 115 03/21/2017 12:00 AM   TRIG 125 03/21/2017 12:00 AM   HDL 28 (L) 03/21/2017 12:00 AM   CHOLHDL 4.1 03/21/2017 12:00 AM   LDLCALC 62 03/21/2017 12:00 AM    Physical Exam:    VS:  BP 122/84   Pulse 90   Ht 5\' 7"  (1.702 m)   Wt 245 lb (111.1 kg)   SpO2 97%   BMI 38.37 kg/m     Wt Readings from Last 3 Encounters:  07/08/17 245 lb (111.1 kg)  04/05/17 247 lb (112 kg)  03/24/17 236 lb 8.9 oz (107.3 kg)     Physical Exam  Constitutional: He is oriented to person, place, and time. He appears well-developed and well-nourished. No distress.  HENT:  Head: Normocephalic and atraumatic.  Neck: No JVD present.  Cardiovascular: Normal rate and regular rhythm.  No murmur heard. Pulmonary/Chest: Effort normal. He has no rales.  Abdominal: Soft.  Musculoskeletal: He exhibits no edema or deformity (R wrist without hematoma).  Neurological: He is alert and oriented to person, place, and time.  Skin: Skin is warm and dry.  Psychiatric: He has a normal mood and affect.   Neck:  JVP normal Lungs CTA  No rhonchi Cardiac RRR  NO S3  No murmurs Abdomen:  Obese  Supple Ext  No edema  2+ pulses  ASSESSMENT:    No diagnosis found. PLAN:     1.NSTEMI (non-ST elevated  myocardial infarction) [facility])  Fall 2018 3v CAD and coronary artery bypass grafting was recommended.  But, he was not able to do this due to financial issues and opted for multivessel PCI.  He is s/p DES to the LAD and DES x 2 to the RCA.  His EF is preserved.  He continues to do well  No Angina  Active Will check CBC and BMET today    2. Essential hypertension BP is good  COntinue meds   3. Pure hypercholesterolemia Will check lipids    4. Type 2 diabetes mellitus with complication, without long-term current use of insulin (HCC) Continue follow up with PCP.  A1C 6.5     Dispo:  F/U in 6 months    Medication Adjustments/Labs and Tests Ordered: Current medicines are reviewed at length with the patient today.  Concerns regarding medicines are outlined above.  Tests Ordered: No orders of the defined types were placed in this encounter.  Medication Changes: No orders of the defined types were placed in this encounter.   Signed, Dietrich Pates, MD  07/08/2017 8:07 AM    [facility] [address] [place], [place], [place]  [address] Phone: [phone]; Fax: [phone]

## 2017-07-14 DIAGNOSIS — E113411 Type 2 diabetes mellitus with severe nonproliferative diabetic retinopathy with macular edema, right eye: Secondary | ICD-10-CM | POA: Diagnosis not present

## 2017-07-14 DIAGNOSIS — E113492 Type 2 diabetes mellitus with severe nonproliferative diabetic retinopathy without macular edema, left eye: Secondary | ICD-10-CM | POA: Diagnosis not present

## 2017-07-14 DIAGNOSIS — H43811 Vitreous degeneration, right eye: Secondary | ICD-10-CM | POA: Diagnosis not present

## 2017-07-14 DIAGNOSIS — H3582 Retinal ischemia: Secondary | ICD-10-CM | POA: Diagnosis not present

## 2017-08-17 DIAGNOSIS — I10 Essential (primary) hypertension: Secondary | ICD-10-CM | POA: Diagnosis not present

## 2017-08-17 DIAGNOSIS — E1129 Type 2 diabetes mellitus with other diabetic kidney complication: Secondary | ICD-10-CM | POA: Diagnosis not present

## 2017-08-17 DIAGNOSIS — E782 Mixed hyperlipidemia: Secondary | ICD-10-CM | POA: Diagnosis not present

## 2017-08-18 DIAGNOSIS — E113411 Type 2 diabetes mellitus with severe nonproliferative diabetic retinopathy with macular edema, right eye: Secondary | ICD-10-CM | POA: Diagnosis not present

## 2017-08-18 DIAGNOSIS — E113492 Type 2 diabetes mellitus with severe nonproliferative diabetic retinopathy without macular edema, left eye: Secondary | ICD-10-CM | POA: Diagnosis not present

## 2017-09-01 DIAGNOSIS — I10 Essential (primary) hypertension: Secondary | ICD-10-CM | POA: Diagnosis not present

## 2017-09-01 DIAGNOSIS — E782 Mixed hyperlipidemia: Secondary | ICD-10-CM | POA: Diagnosis not present

## 2017-09-01 DIAGNOSIS — E1129 Type 2 diabetes mellitus with other diabetic kidney complication: Secondary | ICD-10-CM | POA: Diagnosis not present

## 2017-09-16 DIAGNOSIS — R945 Abnormal results of liver function studies: Secondary | ICD-10-CM | POA: Diagnosis not present

## 2017-09-16 DIAGNOSIS — R748 Abnormal levels of other serum enzymes: Secondary | ICD-10-CM | POA: Diagnosis not present

## 2017-09-22 DIAGNOSIS — H3582 Retinal ischemia: Secondary | ICD-10-CM | POA: Diagnosis not present

## 2017-09-22 DIAGNOSIS — E113413 Type 2 diabetes mellitus with severe nonproliferative diabetic retinopathy with macular edema, bilateral: Secondary | ICD-10-CM | POA: Diagnosis not present

## 2017-09-22 DIAGNOSIS — H43811 Vitreous degeneration, right eye: Secondary | ICD-10-CM | POA: Diagnosis not present

## 2017-09-23 ENCOUNTER — Other Ambulatory Visit: Payer: Self-pay | Admitting: Family Medicine

## 2017-09-23 DIAGNOSIS — R748 Abnormal levels of other serum enzymes: Secondary | ICD-10-CM

## 2017-10-20 DIAGNOSIS — E113413 Type 2 diabetes mellitus with severe nonproliferative diabetic retinopathy with macular edema, bilateral: Secondary | ICD-10-CM | POA: Diagnosis not present

## 2017-10-27 ENCOUNTER — Ambulatory Visit
Admission: RE | Admit: 2017-10-27 | Discharge: 2017-10-27 | Disposition: A | Discharge status: Home / Self Care | Payer: BLUE CROSS/BLUE SHIELD | Source: Ambulatory Visit | Attending: Family Medicine | Admitting: Family Medicine

## 2017-10-27 DIAGNOSIS — R748 Abnormal levels of other serum enzymes: Secondary | ICD-10-CM | POA: Diagnosis not present

## 2017-12-03 DIAGNOSIS — I10 Essential (primary) hypertension: Secondary | ICD-10-CM | POA: Diagnosis not present

## 2017-12-03 DIAGNOSIS — R945 Abnormal results of liver function studies: Secondary | ICD-10-CM | POA: Diagnosis not present

## 2017-12-03 DIAGNOSIS — E1129 Type 2 diabetes mellitus with other diabetic kidney complication: Secondary | ICD-10-CM | POA: Diagnosis not present

## 2017-12-03 DIAGNOSIS — E782 Mixed hyperlipidemia: Secondary | ICD-10-CM | POA: Diagnosis not present

## 2017-12-03 DIAGNOSIS — Z794 Long term (current) use of insulin: Secondary | ICD-10-CM | POA: Diagnosis not present

## 2017-12-17 DIAGNOSIS — H3582 Retinal ischemia: Secondary | ICD-10-CM | POA: Diagnosis not present

## 2017-12-17 DIAGNOSIS — H43811 Vitreous degeneration, right eye: Secondary | ICD-10-CM | POA: Diagnosis not present

## 2017-12-17 DIAGNOSIS — E113413 Type 2 diabetes mellitus with severe nonproliferative diabetic retinopathy with macular edema, bilateral: Secondary | ICD-10-CM | POA: Diagnosis not present

## 2018-02-12 DIAGNOSIS — H539 Unspecified visual disturbance: Secondary | ICD-10-CM | POA: Diagnosis not present

## 2018-02-12 DIAGNOSIS — R51 Headache: Secondary | ICD-10-CM | POA: Diagnosis not present

## 2018-02-12 DIAGNOSIS — H5461 Unqualified visual loss, right eye, normal vision left eye: Secondary | ICD-10-CM | POA: Diagnosis not present

## 2018-02-12 DIAGNOSIS — H538 Other visual disturbances: Secondary | ICD-10-CM | POA: Diagnosis not present

## 2018-02-12 DIAGNOSIS — E11319 Type 2 diabetes mellitus with unspecified diabetic retinopathy without macular edema: Secondary | ICD-10-CM | POA: Diagnosis not present

## 2018-02-14 DIAGNOSIS — E113413 Type 2 diabetes mellitus with severe nonproliferative diabetic retinopathy with macular edema, bilateral: Secondary | ICD-10-CM | POA: Diagnosis not present

## 2018-02-14 DIAGNOSIS — H4311 Vitreous hemorrhage, right eye: Secondary | ICD-10-CM | POA: Diagnosis not present

## 2018-02-14 DIAGNOSIS — H43812 Vitreous degeneration, left eye: Secondary | ICD-10-CM | POA: Diagnosis not present

## 2018-02-22 DIAGNOSIS — E113591 Type 2 diabetes mellitus with proliferative diabetic retinopathy without macular edema, right eye: Secondary | ICD-10-CM | POA: Diagnosis not present

## 2018-02-22 DIAGNOSIS — E113492 Type 2 diabetes mellitus with severe nonproliferative diabetic retinopathy without macular edema, left eye: Secondary | ICD-10-CM | POA: Diagnosis not present

## 2018-02-22 DIAGNOSIS — H4311 Vitreous hemorrhage, right eye: Secondary | ICD-10-CM | POA: Diagnosis not present

## 2018-02-23 ENCOUNTER — Telehealth: Payer: Self-pay

## 2018-02-23 NOTE — Telephone Encounter (Signed)
   [facility] Pre-operative Risk Assessment    Request for surgical clearance:  1. What type of surgery is being performed? Vitrectomy with endolaser right eye   2. When is this surgery scheduled? 03/03/18   3. What type of clearance is required (medical clearance vs. Pharmacy clearance to hold med vs. Both)? Both  4. Are there any medications that need to be held prior to surgery and how long? Stop Brilinta and ASA 5 days prior to surgery   5. Practice name and name of physician performing surgery? [facility]  Dr. Sherlynn Stalls   6. What is your office phone number [phone]    7.   What is your office fax number [phone]  8.   Anesthesia type (None, local, MAC, general) ? Unknown   George Moreno 02/23/2018, 3:17 PM  _________________________________________________________________

## 2018-02-24 NOTE — Telephone Encounter (Signed)
Spoke with Dr Allyne GeeSanders office and they stated that this surgery is not life threatening, but it is urgent. 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

## 2018-02-24 NOTE — Telephone Encounter (Signed)
Please check with Dr. Zella BallSander's office to see if this surgery is urgent, if not, please recommend delay the surgery until after 03/28/2018. Patient's last stent was placed on 03/23/2017, ideally, he needs to complete 12 month of dual antiplatelet medication. The earliest day he can began to hold aspirin and brilinta is 03/23/2018. Mr. George Moreno has a visit with Dr. Tenny Crawoss on 9/20 who can clear him medically.   If the procedure is urgent and needs to be done prior to that, it will have to be forwarded to Dr. Tenny Crawoss for clearance.   Ramond DialSigned, Melanye Hiraldo PA Pager: [phone]

## 2018-02-25 NOTE — Telephone Encounter (Signed)
Clearance letter has been received. I will remove from the Call back pool .

## 2018-02-25 NOTE — Telephone Encounter (Addendum)
   47 year old male with a history of coronary disease status post non-ST elevation MI in September 2018 c/b diastolic heart failure.  Other hx includes diabetes, hypertension, hyperlipidemia.  At the time of his myocardial infarction, bypass surgery was recommended.  However, he declined due to financial constraints.  He underwent multivessel PCI with DES to mid RCA and DES to the distal RCA as well as DES to the proximal LAD.  Patient was last seen in January 2019.  He had a retinal hemorrhage and needs eye surgery.  The patient was contacted at home today.  Since last seen, he has been doing well without chest pain or shortness of breath. RCRI: 6.6% risk of MACE (high risk) DASI: 6.03 METs (good functional status). Therefore, according to ACC/AHA guidelines, the patient is at acceptable risk to proceed with his surgery.  However, I need to review holding ASA and Ticagrelor with Dr. Tenny Crawoss.    I have left a message with Dr. Tenny Crawoss to review holding his antiplatelet therapy earlier than 12 months post MI.  We will be in touch with the patient once I have reviewed further with Dr. Tenny Crawoss. >> Spoke with Dr. Tenny Crawoss.  Ok to hold both if necessary.  Will need both resumed post op.  I have discussed with surgical coordinator with Dr. Allyne GeeSanders.   Tereso NewcomerScott Weaver, PA-C    02/25/2018 10:21 AM

## 2018-02-25 NOTE — Telephone Encounter (Signed)
Follow Up:     George KaufmannMelissa is checking on the status of pt's clearance. They need to know something today as soon as possible please. Pt needs to stop his medicine tomorrow.

## 2018-02-25 NOTE — Telephone Encounter (Signed)
Call back staff: Marland Kitchen. Patient may hold ASA and Brilinta for 5 days prior to his procedure.  I have asked his surgeon to resume his ASA and Brilinta as soon as possible after his surgery.  His surgeon's office should be in touch with him today to discuss his instructions.  . Clearance letter has been faxed to the requesting surgeon. . Please contact the surgeon's office to ensure it has been received. . This phone note will be removed from the preop pool. . Please sign encounter when completed.  Tereso NewcomerScott Jaylina Ramdass, PA-C    02/25/2018 11:03 AM

## 2018-03-03 ENCOUNTER — Encounter: Payer: Self-pay | Admitting: Internal Medicine

## 2018-03-03 DIAGNOSIS — H4311 Vitreous hemorrhage, right eye: Secondary | ICD-10-CM | POA: Diagnosis not present

## 2018-03-03 DIAGNOSIS — E113591 Type 2 diabetes mellitus with proliferative diabetic retinopathy without macular edema, right eye: Secondary | ICD-10-CM | POA: Diagnosis not present

## 2018-03-04 DIAGNOSIS — H4311 Vitreous hemorrhage, right eye: Secondary | ICD-10-CM | POA: Diagnosis not present

## 2018-03-11 DIAGNOSIS — E113591 Type 2 diabetes mellitus with proliferative diabetic retinopathy without macular edema, right eye: Secondary | ICD-10-CM | POA: Diagnosis not present

## 2018-03-11 DIAGNOSIS — E113492 Type 2 diabetes mellitus with severe nonproliferative diabetic retinopathy without macular edema, left eye: Secondary | ICD-10-CM | POA: Diagnosis not present

## 2018-03-18 ENCOUNTER — Encounter: Payer: Self-pay | Admitting: Internal Medicine

## 2018-03-18 ENCOUNTER — Ambulatory Visit (INDEPENDENT_AMBULATORY_CARE_PROVIDER_SITE_OTHER): Payer: BLUE CROSS/BLUE SHIELD | Admitting: Internal Medicine

## 2018-03-18 VITALS — BP 128/82 | HR 89 | Ht 67.0 in | Wt 239.1 lb

## 2018-03-18 DIAGNOSIS — I2510 Atherosclerotic heart disease of native coronary artery without angina pectoris: Secondary | ICD-10-CM | POA: Diagnosis not present

## 2018-03-18 DIAGNOSIS — I10 Essential (primary) hypertension: Secondary | ICD-10-CM | POA: Diagnosis not present

## 2018-03-18 DIAGNOSIS — E782 Mixed hyperlipidemia: Secondary | ICD-10-CM | POA: Diagnosis not present

## 2018-03-18 NOTE — Progress Notes (Signed)
Cardiology Office Note:    Date:  03/18/2018   ID:  George LopesAnthony Beirne, DOB 07/30/1970, MRN [id number]  PCP:  Wilfrid LundBecker, Anna G, PA  Cardiologist:  Dr. Dietrich PatesPaula Tuere Nwosu    Referring MD: Wilfrid LundBecker, Anna G, PA   Pt presents for f/u of CAD    History of Present Illness:    George Moreno is a 47 y.o. male with a hx of diabetes, hypertension, hyperlipidemia.    Admitted 9/22-9/26/18 with fevers and diastolic congestive heart failure in the setting of a non ST elevation myocardial infarction.  LHC demonstrated 3 V CAD.  Patient declined coronary artery bypass grafting and underwent PCI with DES to the mid RCA, DES to the distal RCA and DES to the proximal LAD.  CABG could be considered after 6-12 months.  Post PCI course was uneventful.    The pt has done well  Breathng is good   No CP  His symtposm around time of MI was like he was getting a cold  He has not had this  Had bleed in eye  Had to have surgery in August  Came off of brilinta for a short time   Back on     Prior CV studies:   The following studies were reviewed today:  Cardiac Catheterization 03/23/17 LAD prox 75 LCx ost 50, OM2 70 RCA mid 99, dist 70 PCI: 3.5 x 24 mm Promus Prem DES to pLAD PCI: 2.75 x 28 mm [facility]: 2.25 x 15 mm Synergy DES to dRCA       Cardiac Catheterization 03/22/17 LAD ost 70 LCx ost 50, OM2 75 RCA ost 40, mid 99 thrombotic EF 50-55  Echocardiogram 03/21/17 EF 60-65, inf HK, mild conc LVH   Past Medical History:  Diagnosis Date  . Acute congestive heart failure (HCC)   . Chest pain 03/20/2017  . Diabetes mellitus without complication (HCC)   . Essential hypertension   . Hyperlipidemia   . Hypertension   . Morbid obesity (HCC)   . NSTEMI (non-ST elevated myocardial infarction) (HCC)   . Type II diabetes mellitus (HCC)     Past Surgical History:  Procedure Laterality Date  . CORONARY STENT INTERVENTION N/A 03/23/2017   Procedure: CORONARY STENT INTERVENTION;  Surgeon: SwazilandJordan, Peter M,  MD;  [facility];  Service: Cardiovascular;  Laterality: N/A;  . LEFT HEART CATH AND CORONARY ANGIOGRAPHY N/A 03/22/2017   Procedure: LEFT HEART CATH AND CORONARY ANGIOGRAPHY;  Surgeon: Runell GessBerry, Jonathan J, MD;  Location: [facility] INVASIVE CV LAB;  Service: Cardiovascular;  Laterality: N/A;    Current Medications: Current Meds  Medication Sig  . aspirin EC 81 MG tablet Take 81 mg by mouth daily.  Marland Kitchen. atorvastatin (LIPITOR) 80 MG tablet Take 1 tablet (80 mg total) by mouth every evening.  . carvedilol (COREG) 6.25 MG tablet Take 1 tablet (6.25 mg total) by mouth 2 (two) times daily with a meal.  . fenofibrate (TRICOR) 145 MG tablet Take 145 mg by mouth every evening.  . Insulin Glargine (BASAGLAR KWIKPEN) 100 UNIT/ML SOPN Inject 20 Units into the skin daily.  Marland Kitchen. lisinopril (PRINIVIL,ZESTRIL) 10 MG tablet Take 10 mg by mouth daily.  . metFORMIN (GLUCOPHAGE) 1000 MG tablet Take 1,000 mg by mouth 2 (two) times daily with a meal.  . Multiple Vitamins-Minerals (MULTIVITAMIN PO) Take 1 tablet by mouth daily.  . nitroGLYCERIN (NITROSTAT) 0.4 MG SL tablet Place 1 tablet (0.4 mg total) under the tongue every 5 (five) minutes x  3 doses as needed for chest pain.  . ticagrelor (BRILINTA) 90 MG TABS tablet Take 1 tablet (90 mg total) by mouth 2 (two) times daily.     Allergies:   Patient has no known allergies.   Social History   Socioeconomic History  . Marital status: Single    Spouse name: Not on file  . Number of children: Not on file  . Years of education: Not on file  . Highest education level: Not on file  Occupational History  . Occupation: Event organiser: [employer]  Social Needs  . Financial resource strain: Not on file  . Food insecurity:    Worry: Not on file    Inability: Not on file  . Transportation needs:    Medical: Not on file    Non-medical: Not on file  Tobacco Use  . Smoking status: Never Smoker  . Smokeless tobacco: Never Used  Substance and Sexual  Activity  . Alcohol use: Yes    Comment: rare  . Drug use: Never  . Sexual activity: Not on file  Lifestyle  . Physical activity:    Days per week: Not on file    Minutes per session: Not on file  . Stress: Not on file  Relationships  . Social connections:    Talks on phone: Not on file    Gets together: Not on file    Attends religious service: Not on file    Active member of club or organization: Not on file    Attends meetings of clubs or organizations: Not on file    Relationship status: Not on file  Other Topics Concern  . Not on file  Social History Narrative  . Not on file     Family Hx: The patient's family history is not on file.  ROS:   Please see the history of present illness.    ROS All other systems reviewed and are negative.   EKGs/Labs/Other Test Reviewed:    EKG:  EKG is  ordered today.  The ekg ordered today demonstrates Normal sinus rhythm, HR 79, normal axis, inferior Q waves with T-wave inversions 3, aVF, QTc 412 ms  Recent Labs: 03/20/2017: B Natriuretic Peptide 469.2 03/21/2017: Magnesium 1.8 03/23/2017: ALT 25 07/08/2017: BUN 14; Creatinine, Ser 1.05; Hemoglobin 12.4; Platelets 411; Potassium 4.7; Sodium 142   Recent Lipid Panel Lab Results  Component Value Date/Time   CHOL 88 (L) 07/08/2017 08:27 AM   TRIG 81 07/08/2017 08:27 AM   HDL 22 (L) 07/08/2017 08:27 AM   CHOLHDL 4.0 07/08/2017 08:27 AM   CHOLHDL 4.1 03/21/2017 12:00 AM   LDLCALC 50 07/08/2017 08:27 AM    Physical Exam:    VS:  BP 128/82   Pulse 89   Ht 5\' 7"  (1.702 m)   Wt 239 lb 1.9 oz (108.5 kg)   BMI 37.45 kg/m     Wt Readings from Last 3 Encounters:  03/18/18 239 lb 1.9 oz (108.5 kg)  07/08/17 245 lb (111.1 kg)  04/05/17 247 lb (112 kg)     HEENT   NCAT NECK  JVP is normal   No bruits  Lungs CTA  No rhonchi Cardiac RRR  NO S3  No murmurs Abdomen:  Obese  Supple Ext  No edema  2+ pulses   EKG   SR 89 bpm  Nonspecific ST vhanges with sl ST depr II, III AVF     PLAN:     1.NSTEMI (non-ST elevated myocardial infarction) (HCC)  Fall 2018 3v CAD  He is s/p DES to the LAD and DES x 2 to the RCA.  His EF is preserved.  SInce seen he hs done well  Breathing is good  No CP    Keep on same regimen for now  Just got 3 months of Brilinta   Will review with interventional service Plavix after done vs ASA   2. Essential hypertension BP is OK  Continue     3. Pure hypercholesterolemia LDL has been good   HDL low   Recomm diet and exercise    4. Type 2 diabetes mellitus with complication, without long-term current use of insulin (HCC) Continue follow up with PCP.    Dispo:  F/U in May 2020    Medication Adjustments/Labs and Tests Ordered: Current medicines are reviewed at length with the patient today.  Concerns regarding medicines are outlined above.  Tests Ordered: No orders of the defined types were placed in this encounter.  Medication Changes: No orders of the defined types were placed in this encounter.   Signed, Dietrich Pates, MD  03/18/2018 4:00 PM    [facility] [address] [place], [place], [place]  [address] Phone: [phone]; Fax: [phone]

## 2018-03-18 NOTE — Patient Instructions (Signed)
Your physician recommends that you continue on your current medications as directed. Please refer to the Current Medication list given to you today. Your physician wants you to follow-up in: May You will receive a reminder letter in the mail two months in advance. If you don't receive a letter, please call our office to schedule the follow-up appointment.

## 2018-03-25 DIAGNOSIS — Z794 Long term (current) use of insulin: Secondary | ICD-10-CM | POA: Diagnosis not present

## 2018-03-25 DIAGNOSIS — E1129 Type 2 diabetes mellitus with other diabetic kidney complication: Secondary | ICD-10-CM | POA: Diagnosis not present

## 2018-03-25 DIAGNOSIS — E782 Mixed hyperlipidemia: Secondary | ICD-10-CM | POA: Diagnosis not present

## 2018-03-25 DIAGNOSIS — Z23 Encounter for immunization: Secondary | ICD-10-CM | POA: Diagnosis not present

## 2018-03-25 DIAGNOSIS — I10 Essential (primary) hypertension: Secondary | ICD-10-CM | POA: Diagnosis not present

## 2018-03-25 DIAGNOSIS — R945 Abnormal results of liver function studies: Secondary | ICD-10-CM | POA: Diagnosis not present

## 2018-04-01 ENCOUNTER — Telehealth: Payer: Self-pay | Admitting: *Deleted

## 2018-04-01 DIAGNOSIS — H4312 Vitreous hemorrhage, left eye: Secondary | ICD-10-CM | POA: Diagnosis not present

## 2018-04-01 DIAGNOSIS — H43812 Vitreous degeneration, left eye: Secondary | ICD-10-CM | POA: Diagnosis not present

## 2018-04-01 DIAGNOSIS — E113512 Type 2 diabetes mellitus with proliferative diabetic retinopathy with macular edema, left eye: Secondary | ICD-10-CM | POA: Diagnosis not present

## 2018-04-01 DIAGNOSIS — E113591 Type 2 diabetes mellitus with proliferative diabetic retinopathy without macular edema, right eye: Secondary | ICD-10-CM | POA: Diagnosis not present

## 2018-04-01 NOTE — Telephone Encounter (Signed)
Dr Tenny Craw pt's opthalmologist is requesting pt be off Brilinta and aspirin for 7 days prior to retinal surgery- can you comment and reply to CV preop pool.  Corine Shelter PA-C 04/01/2018 2:23 PM

## 2018-04-01 NOTE — Telephone Encounter (Signed)
   [facility] Pre-operative Risk Assessment    Request for surgical clearance:  1. What type of surgery is being performed? Retina Surgery  2. When is this surgery scheduled? 05/12/18  3. What type of clearance is required (medical clearance vs. Pharmacy clearance to hold med vs. Both)? Both  4. Are there any medications that need to be held prior to surgery and how long? Brilinta and Aspirin for seven days prior  5. Practice name and name of physician performing surgery? [facility] , Sherlynn Stalls, MD  6. What is your office phone number [phone]   7.   What is your office fax number [phone]  8.   Anesthesia type (None, local, MAC, general) ? Not stated   George Moreno 04/01/2018, 1:56 PM  _________________________________________________________________   (provider comments below)

## 2018-04-05 NOTE — Telephone Encounter (Signed)
OK to hold Brilinta and ASA prior to eye surgery    WOuld resume when safe from surgical standpoint

## 2018-04-05 NOTE — Telephone Encounter (Signed)
Dr. Tenny Craw please give recommendations on Brilinta and ASA?

## 2018-04-05 NOTE — Telephone Encounter (Signed)
Routing to Pre [place].

## 2018-04-05 NOTE — Telephone Encounter (Signed)
   Primary Cardiologist: Dietrich Pates, MD  Chart reviewed as part of pre-operative protocol coverage. Given past medical history and time since last visit, based on ACC/AHA guidelines, [place] would be at acceptable risk for the planned procedure without further cardiovascular testing.  See below on Dr. Charlott Rakes recommendations for asprin and Brilinta.  I will route this recommendation to the requesting party via Epic fax function and remove from pre-op pool.  Please call with questions.  Nada Boozer, NP 04/05/2018, 5:04 PM

## 2018-04-15 ENCOUNTER — Telehealth: Payer: Self-pay | Admitting: Internal Medicine

## 2018-04-15 NOTE — Telephone Encounter (Signed)
  Nurse with [employer] needs to check on some medications and if patient has shown any noncompliance with his medications and treatment plan

## 2018-04-20 ENCOUNTER — Telehealth: Payer: Self-pay | Admitting: Internal Medicine

## 2018-04-20 NOTE — Telephone Encounter (Signed)
Spoke with [employer] nurse.  She had questions re: med compliance.  I reviewed that pt was taking all meds as ordered as noted in ov in September.

## 2018-04-20 NOTE — Telephone Encounter (Signed)
Follow up: ° ° °Patient returning call back °

## 2018-04-20 NOTE — Telephone Encounter (Signed)
New Message          George Moreno with George Moreno is calling today to speak with the nurse concerning the patient labs and medications.

## 2018-04-26 ENCOUNTER — Telehealth: Payer: Self-pay | Admitting: Internal Medicine

## 2018-04-26 NOTE — Telephone Encounter (Signed)
Reviewed with interventional service. With multivessel stenting would recomm Either staying on  Brilinta   Switching to 60 mg bid  Check on pricing with insurance  Or switch to plavix if pricing not favorable  75 mg daily  Keep on aspirin.81 mg

## 2018-04-27 NOTE — Telephone Encounter (Signed)
Left message for patient to call back  

## 2018-05-04 ENCOUNTER — Telehealth: Payer: Self-pay | Admitting: *Deleted

## 2018-05-04 MED ORDER — TICAGRELOR 60 MG PO TABS: 60.0000 mg | ORAL_TABLET | Freq: Two times a day (BID) | ORAL | 0 refills | Status: DC

## 2018-05-04 NOTE — Telephone Encounter (Signed)
Addendum: pt also has plan in chart to eventually reduce dose to Brilinta 60mg  BID per d/w Michalene - we discussed that he should resume DAPT as soon as felt safe by the surgeon - he may not have the 60mg  BID by then (mail order) so plans to resume the 90mg  BID when surgeon indicates OK, then transition to 60mg  BID when it arrives.  Dayna Dunn PA-C

## 2018-05-04 NOTE — Telephone Encounter (Signed)
Spoke with [facility] @ Dr. Allyne Gee office. She has been made aware that pt is unaware as to when to start holding his Brilinta And Jonne Ply, and that someone would need to call him to give him instructions. Victorino Dike agreed, and stated that she would place call to pt today and thanked me for letting her know.

## 2018-05-04 NOTE — Telephone Encounter (Addendum)
Spoke with patient who states he would like to take Brilinta 60 mg BID for now, but may need to switch to Plavix due to cost sometime next year.  Will continue original dose (90 mg BID) until receives reduced dose from [facility]. Will send prescription for Brilinta 60 mg BID.  He is aware to continue asa.

## 2018-05-04 NOTE — Telephone Encounter (Signed)
   [facility] Pre-operative Risk Assessment    Request for surgical clearance:  1. What type of surgery is being performed?  Vitrectomy with endolaser left eye   2. When is this surgery scheduled? 05/12/18  3. What type of clearance is required (medical clearance vs. Pharmacy clearance to hold med vs. Both)? both  4. Are there any medications that need to be held prior to surgery and how long? Brilinta and Aspirin - 7 days   5. Practice name and name of physician performing surgery? [facility], [place], Eugenio Hoes, MD   6. What is your office phone number [phone]    7.   What is your office fax number [phone]  8.   Anesthesia type (None, local, MAC, general) ? Not listed   Rodman Key 05/04/2018, 3:39 PM  _________________________________________________________________   (provider comments below)

## 2018-05-04 NOTE — Addendum Note (Signed)
Addended by: Lendon Ka on: 05/04/2018 03:54 PM   Modules accepted: Orders

## 2018-05-04 NOTE — Telephone Encounter (Signed)
   Primary Cardiologist: Dietrich Pates, MD  Chart reviewed as part of pre-operative protocol coverage. Patient was contacted 05/04/2018 in reference to pre-operative risk assessment for pending surgery as outlined below. Berdell Hostetler was last seen on 03/18/18 by Dr. Tenny Craw. Dr. Tenny Craw previously cleared patient to hold Aspirin and Brilinta in setting of eye surgery in 01/2018. I spoke to patient who affirms he is doing well without any new cardiac symptoms and achieves well over 4 METS on a daily basis. Therefore, based on ACC/AHA guidelines, the patient would be at acceptable risk for the planned procedure without further cardiovascular testing. I discussed with DOD Dr. Elease Hashimoto (in Dr. Charlott Rakes absence) who agrees that patient is cleared to hold ASA/Brilinta x 7 days as requested. Patient states he was not totally sure when he was supposed to start holding his meds - I reiterated the recommendation relayed to [place] but will route to callback staff so that they can call surgeon's office to clarify the last day he should take these meds given surgery is just over a week away.  I will route this recommendation to the requesting party via Epic fax function and remove from pre-op pool.  Please call with questions.  Laurann Montana, PA-C 05/04/2018, 4:22 PM

## 2018-05-10 ENCOUNTER — Other Ambulatory Visit: Payer: Self-pay | Admitting: Internal Medicine

## 2018-05-12 DIAGNOSIS — H4312 Vitreous hemorrhage, left eye: Secondary | ICD-10-CM | POA: Diagnosis not present

## 2018-05-12 DIAGNOSIS — E113592 Type 2 diabetes mellitus with proliferative diabetic retinopathy without macular edema, left eye: Secondary | ICD-10-CM | POA: Diagnosis not present

## 2018-05-13 DIAGNOSIS — H4312 Vitreous hemorrhage, left eye: Secondary | ICD-10-CM | POA: Diagnosis not present

## 2018-05-13 DIAGNOSIS — E113512 Type 2 diabetes mellitus with proliferative diabetic retinopathy with macular edema, left eye: Secondary | ICD-10-CM | POA: Diagnosis not present

## 2018-05-24 DIAGNOSIS — E113593 Type 2 diabetes mellitus with proliferative diabetic retinopathy without macular edema, bilateral: Secondary | ICD-10-CM | POA: Diagnosis not present

## 2018-06-10 DIAGNOSIS — E113593 Type 2 diabetes mellitus with proliferative diabetic retinopathy without macular edema, bilateral: Secondary | ICD-10-CM | POA: Diagnosis not present

## 2018-06-15 DIAGNOSIS — E1129 Type 2 diabetes mellitus with other diabetic kidney complication: Secondary | ICD-10-CM | POA: Diagnosis not present

## 2018-06-15 DIAGNOSIS — Z794 Long term (current) use of insulin: Secondary | ICD-10-CM | POA: Diagnosis not present

## 2018-06-15 DIAGNOSIS — E782 Mixed hyperlipidemia: Secondary | ICD-10-CM | POA: Diagnosis not present

## 2018-06-15 DIAGNOSIS — I10 Essential (primary) hypertension: Secondary | ICD-10-CM | POA: Diagnosis not present

## 2018-06-30 DIAGNOSIS — E875 Hyperkalemia: Secondary | ICD-10-CM | POA: Diagnosis not present

## 2018-07-01 DIAGNOSIS — E782 Mixed hyperlipidemia: Secondary | ICD-10-CM | POA: Diagnosis not present

## 2018-07-01 DIAGNOSIS — I519 Heart disease, unspecified: Secondary | ICD-10-CM | POA: Diagnosis not present

## 2018-07-01 DIAGNOSIS — E118 Type 2 diabetes mellitus with unspecified complications: Secondary | ICD-10-CM | POA: Diagnosis not present

## 2018-07-01 DIAGNOSIS — I10 Essential (primary) hypertension: Secondary | ICD-10-CM | POA: Diagnosis not present

## 2018-07-01 DIAGNOSIS — I110 Hypertensive heart disease with heart failure: Secondary | ICD-10-CM | POA: Diagnosis not present

## 2018-07-01 DIAGNOSIS — I259 Chronic ischemic heart disease, unspecified: Secondary | ICD-10-CM | POA: Diagnosis not present

## 2018-07-01 DIAGNOSIS — I517 Cardiomegaly: Secondary | ICD-10-CM | POA: Diagnosis not present

## 2018-07-01 DIAGNOSIS — I5030 Unspecified diastolic (congestive) heart failure: Secondary | ICD-10-CM | POA: Diagnosis not present

## 2018-07-01 DIAGNOSIS — Z955 Presence of coronary angioplasty implant and graft: Secondary | ICD-10-CM | POA: Diagnosis not present

## 2018-07-01 DIAGNOSIS — Z79899 Other long term (current) drug therapy: Secondary | ICD-10-CM | POA: Diagnosis not present

## 2018-07-01 DIAGNOSIS — I252 Old myocardial infarction: Secondary | ICD-10-CM | POA: Diagnosis not present

## 2018-07-01 DIAGNOSIS — I359 Nonrheumatic aortic valve disorder, unspecified: Secondary | ICD-10-CM | POA: Diagnosis not present

## 2018-07-01 DIAGNOSIS — Z7982 Long term (current) use of aspirin: Secondary | ICD-10-CM | POA: Diagnosis not present

## 2018-07-01 DIAGNOSIS — R0789 Other chest pain: Secondary | ICD-10-CM | POA: Diagnosis not present

## 2018-07-01 DIAGNOSIS — R079 Chest pain, unspecified: Secondary | ICD-10-CM | POA: Diagnosis not present

## 2018-07-01 DIAGNOSIS — Z794 Long term (current) use of insulin: Secondary | ICD-10-CM | POA: Diagnosis not present

## 2018-07-01 DIAGNOSIS — R51 Headache: Secondary | ICD-10-CM | POA: Diagnosis not present

## 2018-07-01 DIAGNOSIS — I2510 Atherosclerotic heart disease of native coronary artery without angina pectoris: Secondary | ICD-10-CM | POA: Diagnosis not present

## 2018-07-02 DIAGNOSIS — I259 Chronic ischemic heart disease, unspecified: Secondary | ICD-10-CM | POA: Diagnosis not present

## 2018-07-02 DIAGNOSIS — R0789 Other chest pain: Secondary | ICD-10-CM | POA: Diagnosis not present

## 2018-07-04 DIAGNOSIS — E875 Hyperkalemia: Secondary | ICD-10-CM | POA: Diagnosis not present

## 2018-07-06 DIAGNOSIS — I10 Essential (primary) hypertension: Secondary | ICD-10-CM | POA: Diagnosis not present

## 2018-07-06 DIAGNOSIS — R079 Chest pain, unspecified: Secondary | ICD-10-CM | POA: Diagnosis not present

## 2018-07-06 DIAGNOSIS — E119 Type 2 diabetes mellitus without complications: Secondary | ICD-10-CM | POA: Diagnosis not present

## 2018-07-06 DIAGNOSIS — E875 Hyperkalemia: Secondary | ICD-10-CM | POA: Diagnosis not present

## 2018-07-27 DIAGNOSIS — I10 Essential (primary) hypertension: Secondary | ICD-10-CM | POA: Diagnosis not present

## 2018-07-27 DIAGNOSIS — E875 Hyperkalemia: Secondary | ICD-10-CM | POA: Diagnosis not present

## 2018-08-04 ENCOUNTER — Other Ambulatory Visit: Payer: Self-pay | Admitting: Internal Medicine

## 2018-08-08 DIAGNOSIS — J208 Acute bronchitis due to other specified organisms: Secondary | ICD-10-CM | POA: Diagnosis not present

## 2018-08-08 DIAGNOSIS — R6889 Other general symptoms and signs: Secondary | ICD-10-CM | POA: Diagnosis not present

## 2018-08-08 DIAGNOSIS — B9689 Other specified bacterial agents as the cause of diseases classified elsewhere: Secondary | ICD-10-CM | POA: Diagnosis not present

## 2018-08-10 DIAGNOSIS — E875 Hyperkalemia: Secondary | ICD-10-CM | POA: Diagnosis not present

## 2018-08-15 IMAGING — DX DG CHEST 1V PORT
1 series · 1 of 1 positions shown · non-contrast
Comparison: 03/20/2017

CLINICAL DATA: Shortness of breath and possible CHF.

EXAM:
PORTABLE CHEST 1 VIEW

[chest]
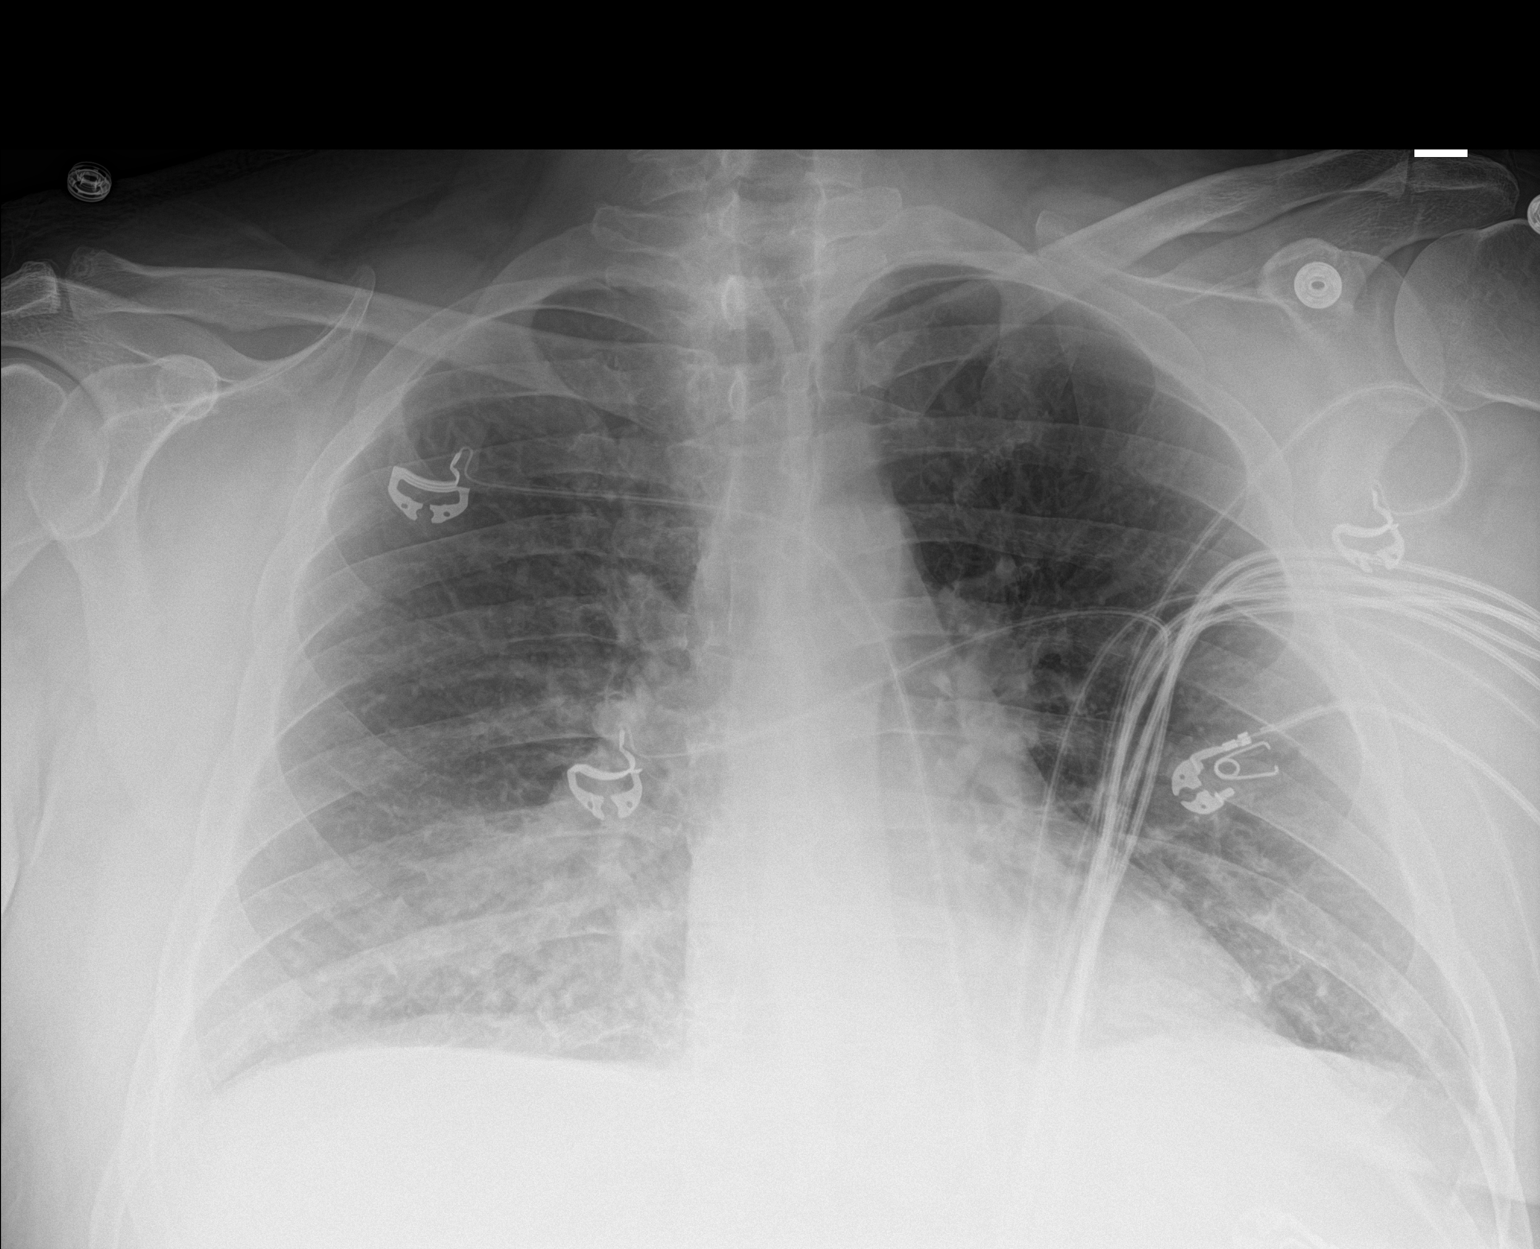

[1 of 1 positions shown; findings below may reference images not displayed]

FINDINGS: AP portable view of the chest was obtained. Slightly low lung
volumes. Again noted are prominent central vascular structures.
Prominent lung markings at the right lung base. Heart size is within
normal limits. Trachea is midline. Negative for a pneumothorax.
IMPRESSION: Prominent central vascular structures, particularly in the right
lower chest. Findings may be related to low lung volumes and
vascular congestion.

## 2018-08-18 IMAGING — CR DG CHEST 2V
2 series · 2 of 2 positions shown · non-contrast
Comparison: Radiograph March 20, 2017.

CLINICAL DATA: Chest pain.

EXAM:
CHEST  2 VIEW

[chest pa]
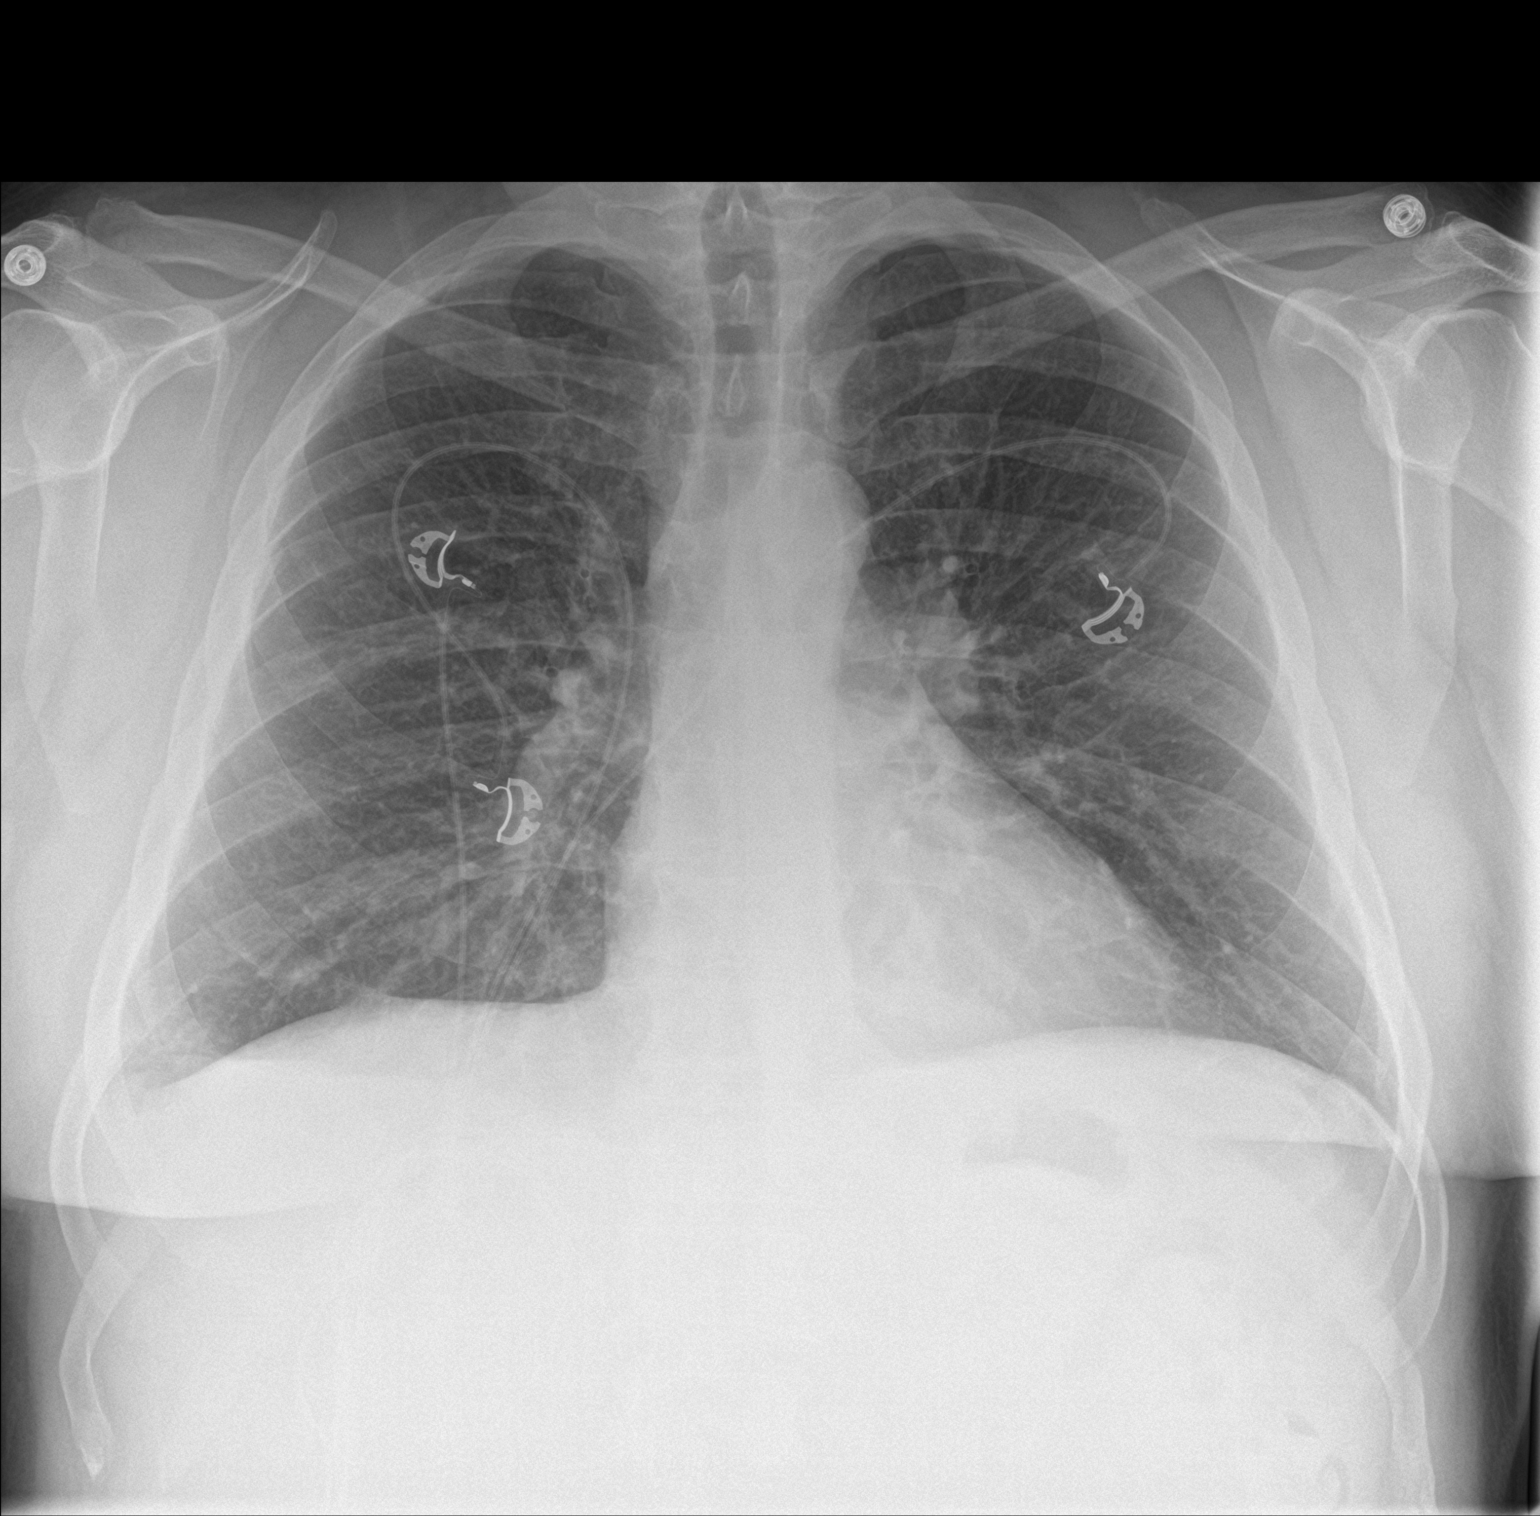

[chest lat]
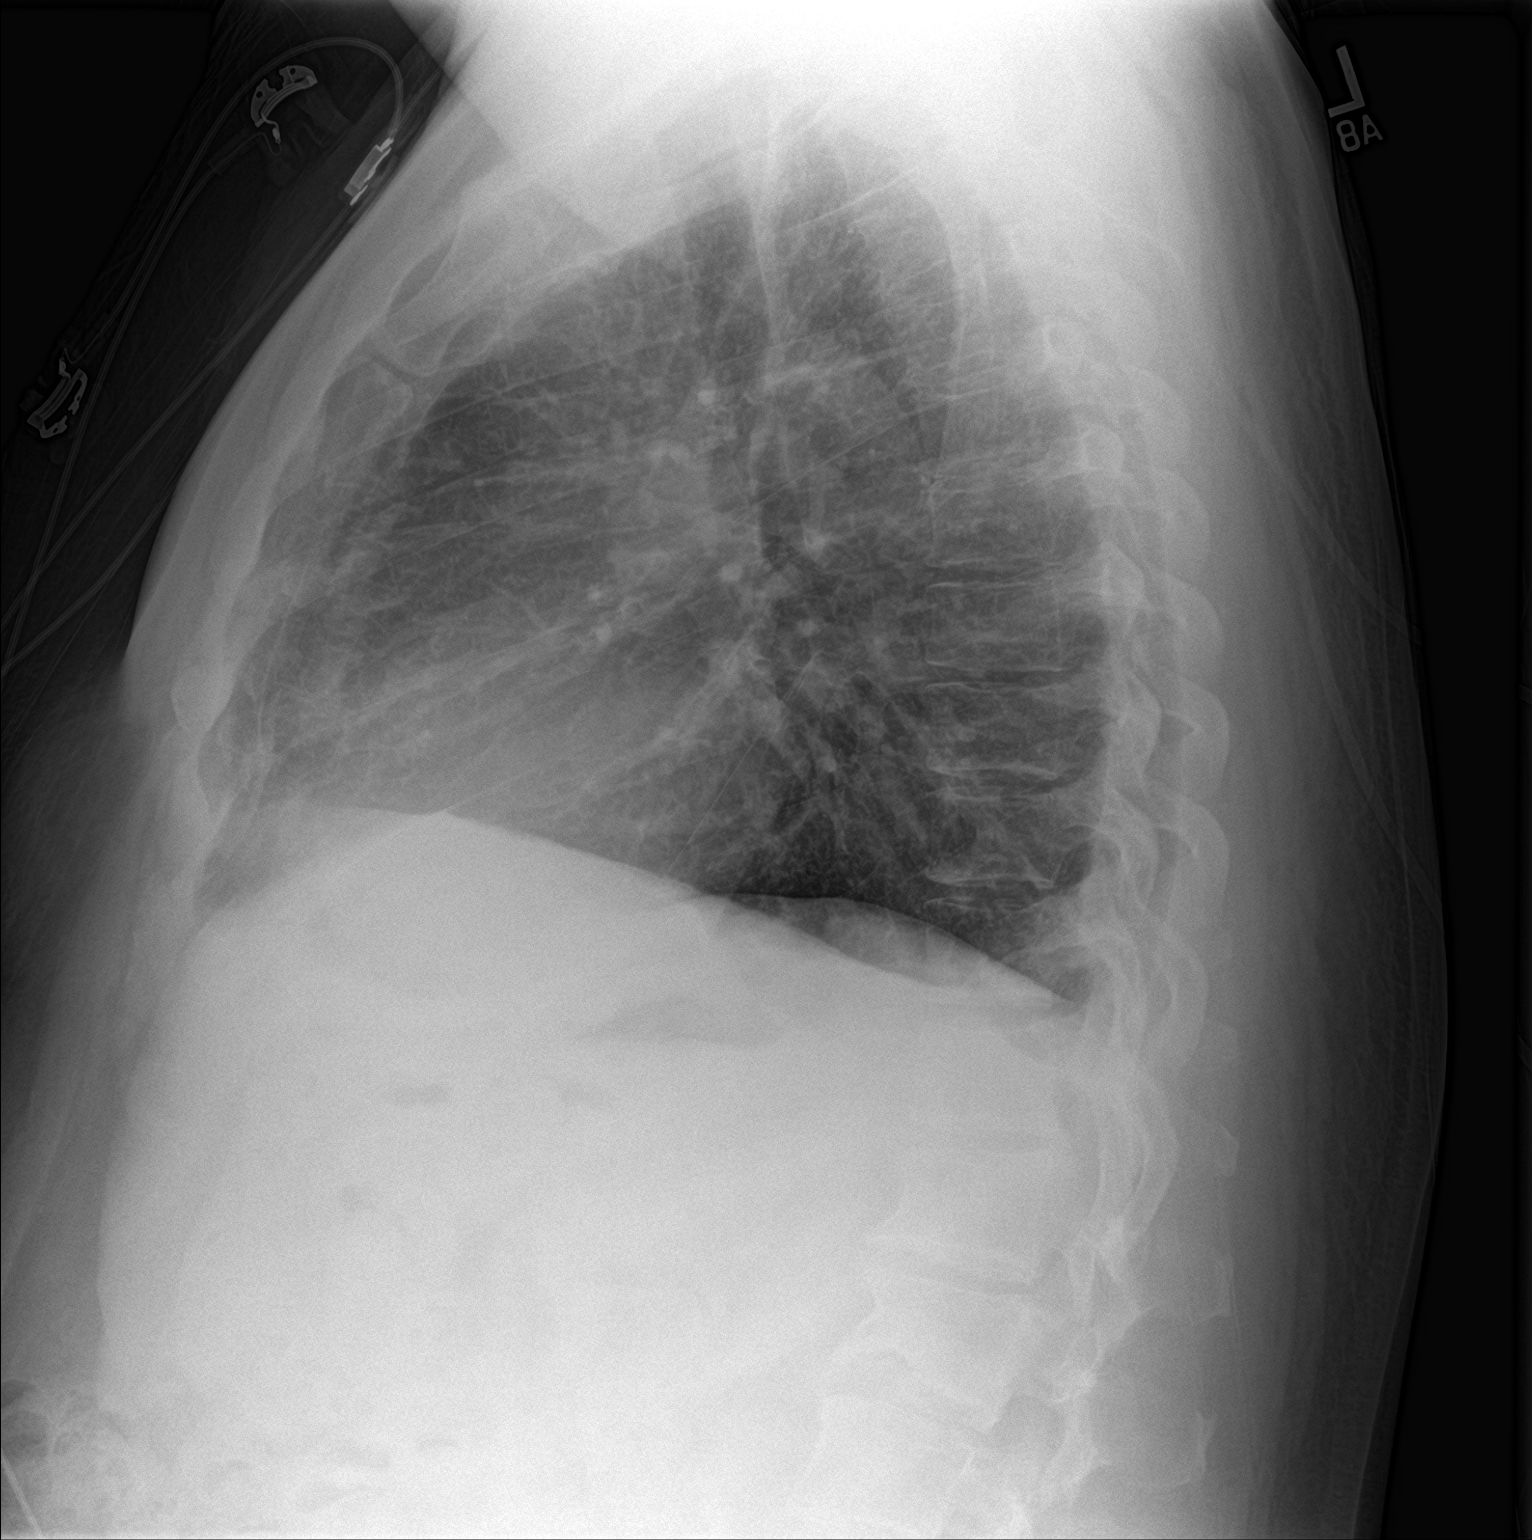

[2 of 2 positions shown; findings below may reference images not displayed]

FINDINGS: The heart size and mediastinal contours are within normal limits. No
pneumothorax is noted. Left lung is clear. Minimal right basilar
subsegmental atelectasis is noted with minimal right pleural
effusion. The visualized skeletal structures are unremarkable.
IMPRESSION: Minimal right basilar subsegmental atelectasis with minimal right
pleural effusion.

## 2018-12-20 DIAGNOSIS — E113593 Type 2 diabetes mellitus with proliferative diabetic retinopathy without macular edema, bilateral: Secondary | ICD-10-CM | POA: Diagnosis not present

## 2018-12-21 ENCOUNTER — Telehealth: Payer: Self-pay

## 2018-12-21 NOTE — Telephone Encounter (Signed)
Virtual Visit Pre-Appointment Phone Call  "(Name), I am calling you today to discuss your upcoming appointment. We are currently trying to limit exposure to the virus that causes COVID-19 by seeing patients at home rather than in the office."  1. "What is the BEST phone number to call the day of the visit?" - include this in appointment notes  2. "Do you have or have access to (through a family member/friend) a smartphone with video capability that we can use for your visit?" a. If yes - list this number in appt notes as "cell" (if different from BEST phone #) and list the appointment type as a VIDEO visit in appointment notes b. If no - list the appointment type as a PHONE visit in appointment notes  3. Confirm consent - "In the setting of the current Covid19 crisis, you are scheduled for a (phone or video) visit with your provider on (date) at (time).  Just as we do with many in-office visits, in order for you to participate in this visit, we must obtain consent.  If you'd like, I can send this to your mychart (if signed up) or email for you to review.  Otherwise, I can obtain your verbal consent now.  All virtual visits are billed to your insurance company just like a normal visit would be.  By agreeing to a virtual visit, we'd like you to understand that the technology does not allow for your provider to perform an examination, and thus may limit your provider's ability to fully assess your condition. If your provider identifies any concerns that need to be evaluated in person, we will make arrangements to do so.  Finally, though the technology is pretty good, we cannot assure that it will always work on either your or our end, and in the setting of a video visit, we may have to convert it to a phone-only visit.  In either situation, we cannot ensure that we have a secure connection.  Are you willing to proceed?" STAFF: Did the patient verbally acknowledge consent to telehealth visit? Document  YES/NO here: YES  4. Advise patient to be prepared - "Two hours prior to your appointment, go ahead and check your blood pressure, pulse, oxygen saturation, and your weight (if you have the equipment to check those) and write them all down. When your visit starts, your provider will ask you for this information. If you have an Apple Watch or Kardia device, please plan to have heart rate information ready on the day of your appointment. Please have a pen and paper handy nearby the day of the visit as well."  5. Give patient instructions for MyChart download to smartphone OR [email or web address].me as below if video visit (depending on what platform provider is using)  6. Inform patient they will receive a phone call 15 minutes prior to their appointment time (may be from unknown caller ID) so they should be prepared to answer    TELEPHONE CALL NOTE  George Moreno Cardarelli has been deemed a candidate for a follow-up tele-health visit to limit community exposure during the Covid-19 pandemic. I spoke with the patient via phone to ensure availability of phone/video source, confirm preferred email & phone number, and discuss instructions and expectations.  I reminded George Moreno Reamer to be prepared with any vital sign and/or heart rhythm information that could potentially be obtained via home monitoring, at the time of his visit. I reminded George Moreno Knotek to expect a phone call prior to his visit.  Jamir Rone, [place] 12/21/2018 2:06 PM   INSTRUCTIONS FOR DOWNLOADING THE MYCHART APP TO SMARTPHONE  - The patient must first make sure to have activated MyChart and know their login information - If Apple, go to [employer] and type in MyChart in the search bar and download the app. If Android, ask patient to go to [employer] and type in [place] in the search bar and download the app. The app is free but as with any other app downloads, their phone may require them to verify saved payment information or Apple/Android  password.  - The patient will need to then log into the app with their MyChart username and password, and select [facility] as their healthcare provider to link the account. When it is time for your visit, go to the MyChart app, find appointments, and click Begin Video Visit. Be sure to Select Allow for your device to access the Microphone and Camera for your visit. You will then be connected, and your provider will be with you shortly.  **If they have any issues connecting, or need assistance please contact MyChart service desk [phone])**  **If using a computer, in order to ensure the best quality for their visit they will need to use either of the following Internet Browsers: [employer], or Google Chrome**  IF USING DOXIMITY or DOXY.ME - The patient will receive a link just prior to their visit by text.     FULL LENGTH CONSENT FOR TELE-HEALTH VISIT   I hereby voluntarily request, consent and authorize [facility] and its employed or contracted physicians, physician assistants, nurse practitioners or other licensed health care professionals (the Practitioner), to provide me with telemedicine health care services (the "Services") as deemed necessary by the treating Practitioner. I acknowledge and consent to receive the Services by the Practitioner via telemedicine. I understand that the telemedicine visit will involve communicating with the Practitioner through live audiovisual communication technology and the disclosure of certain medical information by electronic transmission. I acknowledge that I have been given the opportunity to request an in-person assessment or other available alternative prior to the telemedicine visit and am voluntarily participating in the telemedicine visit.  I understand that I have the right to withhold or withdraw my consent to the use of telemedicine in the course of my care at any time, without affecting my right to future care or treatment,  and that the Practitioner or I may terminate the telemedicine visit at any time. I understand that I have the right to inspect all information obtained and/or recorded in the course of the telemedicine visit and may receive copies of available information for a reasonable fee.  I understand that some of the potential risks of receiving the Services via telemedicine include:  Marland Kitchen Delay or interruption in medical evaluation due to technological equipment failure or disruption; . Information transmitted may not be sufficient (e.g. poor resolution of images) to allow for appropriate medical decision making by the Practitioner; and/or  . In rare instances, security protocols could fail, causing a breach of personal health information.  Furthermore, I acknowledge that it is my responsibility to provide information about my medical history, conditions and care that is complete and accurate to the best of my ability. I acknowledge that Practitioner's advice, recommendations, and/or decision may be based on factors not within their control, such as incomplete or inaccurate data provided by me or distortions of diagnostic images or specimens that may result from electronic transmissions. I understand that the  practice of medicine is not an Chief Strategy Officer and that Practitioner makes no warranties or guarantees regarding treatment outcomes. I acknowledge that I will receive a copy of this consent concurrently upon execution via email to the email address I last provided but may also request a printed copy by calling the office of [facility].    I understand that my insurance will be billed for this visit.   I have read or had this consent read to me. . I understand the contents of this consent, which adequately explains the benefits and risks of the Services being provided via telemedicine.  . I have been provided ample opportunity to ask questions regarding this consent and the Services and have had my questions  answered to my satisfaction. . I give my informed consent for the services to be provided through the use of telemedicine in my medical care  By participating in this telemedicine visit I agree to the above.

## 2018-12-25 NOTE — Progress Notes (Signed)
Virtual Visit via Video Note   This visit type was conducted due to national recommendations for restrictions regarding the COVID-19 Pandemic (e.g. social distancing) in an effort to limit this patient's exposure and mitigate transmission in our community.  Due to his co-morbid illnesses, this patient is at least at moderate risk for complications without adequate follow up.  This format is felt to be most appropriate for this patient at this time.  All issues noted in this document were discussed and addressed.  A limited physical exam was performed with this format.  Please refer to the patient's chart for his consent to telehealth for [facility].   Date:  12/26/2018   ID:  George Moreno, DOB 08/09/1970, MRN [id number]  Patient Location: Home Provider Location: Home  PCP:  Wilfrid LundBecker, Anna G, PA  Cardiologist:  Dietrich PatesPaula Arlinda Barcelona, MD    Evaluation Performed:  Follow-Up Visit  Chief Complaint:  F/U of diastolic CHF, HTN  History of Present Illness:    George Lopesnthony Moreno is a 48 y.o. male with hx of diastolic CHF, CAD, HTN and HL   Cath in 2018 after NSTEMI showed 3 V CAD  Pt declined CABG and underwent PCI with DES to mid RCA, distal RCA and Prox LAD    I last saw the tp in Sept 2019  He went to ED in Jan for episode of CP   Atypical   K had been high prior   Was upper normal at 5.4     Since then he says he has been doing well from a cardiac standpoint       Last K at [facility] was 5.4 (upper normal)   DLD was 49, HDL 27  Trig 90     Since seen, Doing well   In Jan his potassium got high   The patient does not have symptoms concerning for COVID-19 infection (fever, chills, cough, or new shortness of breath).    Past Medical History:  Diagnosis Date  . Acute congestive heart failure (HCC)   . Chest pain 03/20/2017  . Diabetes mellitus without complication (HCC)   . Essential hypertension   . Hyperlipidemia   . Hypertension   . Morbid obesity (HCC)   . NSTEMI (non-ST elevated myocardial  infarction) (HCC)   . Type II diabetes mellitus (HCC)    Past Surgical History:  Procedure Laterality Date  . CORONARY STENT INTERVENTION N/A 03/23/2017   Procedure: CORONARY STENT INTERVENTION;  Surgeon: SwazilandJordan, Peter M, MD;  [facility];  Service: Cardiovascular;  Laterality: N/A;  . LEFT HEART CATH AND CORONARY ANGIOGRAPHY N/A 03/22/2017   Procedure: LEFT HEART CATH AND CORONARY ANGIOGRAPHY;  Surgeon: Runell GessBerry, Jonathan J, MD;  Location: [facility] INVASIVE CV LAB;  Service: Cardiovascular;  Laterality: N/A;     Current Meds  Medication Sig  . aspirin EC 81 MG tablet Take 81 mg by mouth daily.  Marland Kitchen. atorvastatin (LIPITOR) 80 MG tablet TAKE 1 TABLET EVERY EVENING  . BRILINTA 60 MG TABS tablet TAKE 1 TABLET TWICE A DAY (DOSAGE DECREASE)  . carvedilol (COREG) 6.25 MG tablet Take 1 tablet (6.25 mg total) by mouth 2 (two) times daily with a meal.  . fenofibrate (TRICOR) 145 MG tablet Take 145 mg by mouth every evening.  . Insulin Glargine (BASAGLAR KWIKPEN) 100 UNIT/ML SOPN Inject 22 Units into the skin daily.   Marland Kitchen. lisinopril (PRINIVIL,ZESTRIL) 10 MG tablet Take 5 mg by mouth daily.   . metFORMIN (GLUCOPHAGE) 1000 MG tablet Take 1,000  mg by mouth 2 (two) times daily with a meal.  . Multiple Vitamins-Minerals (MULTIVITAMIN PO) Take 1 tablet by mouth daily.  . nitroGLYCERIN (NITROSTAT) 0.4 MG SL tablet Place 1 tablet (0.4 mg total) under the tongue every 5 (five) minutes x 3 doses as needed for chest pain.     Allergies:   Patient has no known allergies.   Social History   Tobacco Use  . Smoking status: Never Smoker  . Smokeless tobacco: Never Used  Substance Use Topics  . Alcohol use: Yes    Comment: rare  . Drug use: Never     Family Hx: The patient's family history is not on file.  ROS:   Please see the history of present illness.     All other systems reviewed and are negative.   Prior CV studies:   The following studies were reviewed today:    Labs/Other Tests and  Data Reviewed:    EKG:  No ECG reviewed.  Recent Labs: No results found for requested labs within last 8760 hours.   Recent Lipid Panel Lab Results  Component Value Date/Time   CHOL 88 (L) 07/08/2017 08:27 AM   TRIG 81 07/08/2017 08:27 AM   HDL 22 (L) 07/08/2017 08:27 AM   CHOLHDL 4.0 07/08/2017 08:27 AM   CHOLHDL 4.1 03/21/2017 12:00 AM   LDLCALC 50 07/08/2017 08:27 AM    Wt Readings from Last 3 Encounters:  12/26/18 245 lb (111.1 kg)  03/18/18 239 lb 1.9 oz (108.5 kg)  07/08/17 245 lb (111.1 kg)     Objective:    Vital Signs:  Pulse 72   Ht 5\' 7"  (1.702 m)   Wt 245 lb (111.1 kg)   BMI 38.37 kg/m   BP 110s -130s/     Glucose 110s     ASSESSMENT & PLAN:    1. CAD  No symptoms of angina  2   HTN  BP has been good   His K has been borderline to high   WOuld rcomm trial of cutting lisinopril to 2.5 mg    Follow BP   If goes into 130s, go back to 5 mg.    3   HL   Lipids were  Excellent in Jan   Continue   4  Morbid obesity  Discussed diet and portion control   Increase exercise    5  COVID-19 Education: The signs and symptoms of COVID-19 were discussed with the patient and how to seek care for testing (follow up with PCP or arrange E-visit).  The importance of social distancing was discussed today.  Time:   Today, I have spent 20 minutes with the patient with telehealth technology discussing the above problems.     Medication Adjustments/Labs and Tests Ordered: Current medicines are reviewed at length with the patient today.  Concerns regarding medicines are outlined above.   Tests Ordered: No orders of the defined types were placed in this encounter.   Medication Changes: No orders of the defined types were placed in this encounter.   Follow Up:  F/U in March 2021  Signed, Dorris Carnes, MD  12/26/2018 9:21 AM    [facility]

## 2018-12-26 ENCOUNTER — Other Ambulatory Visit: Payer: Self-pay

## 2018-12-26 ENCOUNTER — Telehealth (INDEPENDENT_AMBULATORY_CARE_PROVIDER_SITE_OTHER): Payer: BC Managed Care – PPO | Admitting: Internal Medicine

## 2018-12-26 ENCOUNTER — Encounter: Payer: Self-pay | Admitting: Internal Medicine

## 2018-12-26 VITALS — HR 72 | Ht 67.0 in | Wt 245.0 lb

## 2018-12-26 DIAGNOSIS — E782 Mixed hyperlipidemia: Secondary | ICD-10-CM | POA: Diagnosis not present

## 2018-12-26 DIAGNOSIS — I2510 Atherosclerotic heart disease of native coronary artery without angina pectoris: Secondary | ICD-10-CM | POA: Diagnosis not present

## 2018-12-26 DIAGNOSIS — I10 Essential (primary) hypertension: Secondary | ICD-10-CM

## 2018-12-26 MED ORDER — LISINOPRIL 2.5 MG PO TABS: 2.5000 mg | ORAL_TABLET | Freq: Every day | ORAL | 3 refills | Status: DC

## 2018-12-26 NOTE — Addendum Note (Signed)
Addended by: Rodman Key on: 12/26/2018 11:45 AM   Modules accepted: Orders

## 2018-12-26 NOTE — Patient Instructions (Signed)
Medication Instructions:  Your physician has recommended you make the following change in your medication:  1.) decrease lisinopril to 2.5 mg once a day----new prescription for this dose sent to [facility].  ---see below re: blood pressure---  If you need a refill on your cardiac medications before your next appointment, please call your pharmacy.   Lab work: none  Testing/Procedures: none  Follow-Up: At [facility], you and your health needs are our priority.  As part of our continuing mission to provide you with exceptional heart care, we have created designated Provider Care Teams.  These Care Teams include your primary Cardiologist (physician) and Advanced Practice Providers (APPs -  Physician Assistants and Nurse Practitioners) who all work together to provide you with the care you need, when you need it. You will need a follow up appointment in:  9 months.  Please call our office 2 months in advance to schedule this appointment.  You may see Dorris Carnes, MD or one of the following Advanced Practice Providers on your designated Care Team: Richardson Dopp, PA-C [place], [place] . Daune Perch, NP  Any Other Special Instructions Will Be Listed Below (If Applicable). Monitor blood pressure at home.  If consistently greater than 130 (top number), increase to 5 mg daily and please call to let Dr. Harrington Challenger know this.

## 2019-02-05 ENCOUNTER — Other Ambulatory Visit: Payer: Self-pay | Admitting: Internal Medicine

## 2019-03-25 ENCOUNTER — Other Ambulatory Visit: Payer: Self-pay | Admitting: Internal Medicine

## 2019-04-16 ENCOUNTER — Other Ambulatory Visit: Payer: Self-pay | Admitting: Internal Medicine

## 2019-05-04 DIAGNOSIS — I10 Essential (primary) hypertension: Secondary | ICD-10-CM | POA: Diagnosis not present

## 2019-05-04 DIAGNOSIS — Z794 Long term (current) use of insulin: Secondary | ICD-10-CM | POA: Diagnosis not present

## 2019-05-04 DIAGNOSIS — E1129 Type 2 diabetes mellitus with other diabetic kidney complication: Secondary | ICD-10-CM | POA: Diagnosis not present

## 2019-05-04 DIAGNOSIS — E1142 Type 2 diabetes mellitus with diabetic polyneuropathy: Secondary | ICD-10-CM | POA: Diagnosis not present

## 2019-05-17 DIAGNOSIS — Z9889 Other specified postprocedural states: Secondary | ICD-10-CM | POA: Diagnosis not present

## 2019-05-17 DIAGNOSIS — M25561 Pain in right knee: Secondary | ICD-10-CM | POA: Diagnosis not present

## 2019-05-17 DIAGNOSIS — M25562 Pain in left knee: Secondary | ICD-10-CM | POA: Diagnosis not present

## 2019-05-18 DIAGNOSIS — E782 Mixed hyperlipidemia: Secondary | ICD-10-CM | POA: Diagnosis not present

## 2019-05-18 DIAGNOSIS — E1129 Type 2 diabetes mellitus with other diabetic kidney complication: Secondary | ICD-10-CM | POA: Diagnosis not present

## 2019-05-31 DIAGNOSIS — Z9889 Other specified postprocedural states: Secondary | ICD-10-CM | POA: Diagnosis not present

## 2019-05-31 DIAGNOSIS — M25561 Pain in right knee: Secondary | ICD-10-CM | POA: Diagnosis not present

## 2019-05-31 DIAGNOSIS — M2391 Unspecified internal derangement of right knee: Secondary | ICD-10-CM | POA: Diagnosis not present

## 2019-05-31 DIAGNOSIS — M25562 Pain in left knee: Secondary | ICD-10-CM | POA: Diagnosis not present

## 2019-06-08 DIAGNOSIS — M2391 Unspecified internal derangement of right knee: Secondary | ICD-10-CM | POA: Diagnosis not present

## 2019-06-08 DIAGNOSIS — X58XXXA Exposure to other specified factors, initial encounter: Secondary | ICD-10-CM | POA: Diagnosis not present

## 2019-06-08 DIAGNOSIS — M25461 Effusion, right knee: Secondary | ICD-10-CM | POA: Diagnosis not present

## 2019-06-08 DIAGNOSIS — M65861 Other synovitis and tenosynovitis, right lower leg: Secondary | ICD-10-CM | POA: Diagnosis not present

## 2019-06-08 DIAGNOSIS — S83281A Other tear of lateral meniscus, current injury, right knee, initial encounter: Secondary | ICD-10-CM | POA: Diagnosis not present

## 2019-06-08 DIAGNOSIS — M222X1 Patellofemoral disorders, right knee: Secondary | ICD-10-CM | POA: Diagnosis not present

## 2019-06-14 DIAGNOSIS — E113591 Type 2 diabetes mellitus with proliferative diabetic retinopathy without macular edema, right eye: Secondary | ICD-10-CM | POA: Diagnosis not present

## 2019-06-14 DIAGNOSIS — E113512 Type 2 diabetes mellitus with proliferative diabetic retinopathy with macular edema, left eye: Secondary | ICD-10-CM | POA: Diagnosis not present

## 2019-06-16 DIAGNOSIS — E113512 Type 2 diabetes mellitus with proliferative diabetic retinopathy with macular edema, left eye: Secondary | ICD-10-CM | POA: Diagnosis not present

## 2019-07-04 DIAGNOSIS — S83289D Other tear of lateral meniscus, current injury, unspecified knee, subsequent encounter: Secondary | ICD-10-CM | POA: Diagnosis not present

## 2019-07-04 DIAGNOSIS — X58XXXD Exposure to other specified factors, subsequent encounter: Secondary | ICD-10-CM | POA: Diagnosis not present

## 2019-07-04 DIAGNOSIS — Z01812 Encounter for preprocedural laboratory examination: Secondary | ICD-10-CM | POA: Diagnosis not present

## 2019-07-04 DIAGNOSIS — M23032 Cystic meniscus, other medial meniscus, left knee: Secondary | ICD-10-CM | POA: Diagnosis not present

## 2019-07-04 DIAGNOSIS — Z20822 Contact with and (suspected) exposure to covid-19: Secondary | ICD-10-CM | POA: Diagnosis not present

## 2019-07-06 DIAGNOSIS — X58XXXA Exposure to other specified factors, initial encounter: Secondary | ICD-10-CM | POA: Diagnosis not present

## 2019-07-06 DIAGNOSIS — Z01810 Encounter for preprocedural cardiovascular examination: Secondary | ICD-10-CM | POA: Diagnosis not present

## 2019-07-06 DIAGNOSIS — S83281A Other tear of lateral meniscus, current injury, right knee, initial encounter: Secondary | ICD-10-CM | POA: Diagnosis not present

## 2019-07-09 DIAGNOSIS — I2510 Atherosclerotic heart disease of native coronary artery without angina pectoris: Secondary | ICD-10-CM | POA: Diagnosis not present

## 2019-07-09 DIAGNOSIS — S83281A Other tear of lateral meniscus, current injury, right knee, initial encounter: Secondary | ICD-10-CM | POA: Diagnosis not present

## 2019-07-09 DIAGNOSIS — Z01810 Encounter for preprocedural cardiovascular examination: Secondary | ICD-10-CM | POA: Diagnosis not present

## 2019-07-11 DIAGNOSIS — S83281A Other tear of lateral meniscus, current injury, right knee, initial encounter: Secondary | ICD-10-CM | POA: Diagnosis not present

## 2019-07-11 DIAGNOSIS — M65861 Other synovitis and tenosynovitis, right lower leg: Secondary | ICD-10-CM | POA: Diagnosis not present

## 2019-07-11 DIAGNOSIS — M23261 Derangement of other lateral meniscus due to old tear or injury, right knee: Secondary | ICD-10-CM | POA: Diagnosis not present

## 2019-07-11 DIAGNOSIS — M94261 Chondromalacia, right knee: Secondary | ICD-10-CM | POA: Diagnosis not present

## 2019-07-11 DIAGNOSIS — X58XXXA Exposure to other specified factors, initial encounter: Secondary | ICD-10-CM | POA: Diagnosis not present

## 2019-07-11 HISTORY — PX: KNEE ARTHROPLASTY: SHX992

## 2019-07-26 DIAGNOSIS — M25661 Stiffness of right knee, not elsewhere classified: Secondary | ICD-10-CM | POA: Diagnosis not present

## 2019-07-26 DIAGNOSIS — S83281D Other tear of lateral meniscus, current injury, right knee, subsequent encounter: Secondary | ICD-10-CM | POA: Diagnosis not present

## 2019-07-26 DIAGNOSIS — M6281 Muscle weakness (generalized): Secondary | ICD-10-CM | POA: Diagnosis not present

## 2019-07-26 DIAGNOSIS — X58XXXD Exposure to other specified factors, subsequent encounter: Secondary | ICD-10-CM | POA: Diagnosis not present

## 2019-07-31 DIAGNOSIS — M25661 Stiffness of right knee, not elsewhere classified: Secondary | ICD-10-CM | POA: Diagnosis not present

## 2019-07-31 DIAGNOSIS — M6281 Muscle weakness (generalized): Secondary | ICD-10-CM | POA: Diagnosis not present

## 2019-08-02 DIAGNOSIS — M6281 Muscle weakness (generalized): Secondary | ICD-10-CM | POA: Diagnosis not present

## 2019-08-02 DIAGNOSIS — M25661 Stiffness of right knee, not elsewhere classified: Secondary | ICD-10-CM | POA: Diagnosis not present

## 2019-08-11 ENCOUNTER — Encounter: Payer: Self-pay | Admitting: Internal Medicine

## 2019-08-11 ENCOUNTER — Other Ambulatory Visit (HOSPITAL_COMMUNITY)
Admission: RE | Admit: 2019-08-11 | Discharge: 2019-08-11 | Disposition: A | Discharge status: Home / Self Care | Payer: BC Managed Care – PPO | Source: Ambulatory Visit | Attending: Internal Medicine | Admitting: Internal Medicine

## 2019-08-11 ENCOUNTER — Other Ambulatory Visit: Payer: Self-pay

## 2019-08-11 ENCOUNTER — Ambulatory Visit (INDEPENDENT_AMBULATORY_CARE_PROVIDER_SITE_OTHER): Payer: BC Managed Care – PPO | Admitting: Internal Medicine

## 2019-08-11 VITALS — BP 164/87 | HR 82 | Temp 98.7°F | Ht 68.0 in | Wt 249.0 lb

## 2019-08-11 DIAGNOSIS — E782 Mixed hyperlipidemia: Secondary | ICD-10-CM

## 2019-08-11 LAB — BASIC METABOLIC PANEL
Anion gap: 7 (ref 5–15)
BUN: 12 mg/dL (ref 6–20)
CO2: 25 mmol/L (ref 22–32)
Calcium: 9 mg/dL (ref 8.9–10.3)
Chloride: 103 mmol/L (ref 98–111)
Creatinine, Ser: 1 mg/dL (ref 0.61–1.24)
GFR calc Af Amer: >60 mL/min (ref >60)
GFR calc non Af Amer: >60 mL/min (ref >60)
Glucose, Bld: 249 mg/dL — ABNORMAL HIGH (ref 70–99)
Potassium: 4.4 mmol/L (ref 3.5–5.1)
Sodium: 135 mmol/L (ref 135–145)

## 2019-08-11 LAB — LIPID PANEL
Cholesterol: 119 mg/dL (ref 0–200)
HDL: 31 mg/dL — ABNORMAL LOW (ref >40)
LDL Cholesterol: 70 mg/dL (ref 0–99)
Total CHOL/HDL Ratio: 3.8 RATIO
Triglycerides: 91 mg/dL (ref <150)
VLDL: 18 mg/dL (ref 0–40)

## 2019-08-11 LAB — HEPATIC FUNCTION PANEL
ALT: 32 U/L (ref 0–44)
AST: 21 U/L (ref 15–41)
Albumin: 3.9 g/dL (ref 3.5–5.0)
Alkaline Phosphatase: 60 U/L (ref 38–126)
Bilirubin, Direct: <0.1 mg/dL (ref 0.0–0.2)
Total Bilirubin: 0.6 mg/dL (ref 0.3–1.2)
Total Protein: 6.7 g/dL (ref 6.5–8.1)

## 2019-08-11 LAB — CBC
HCT: 42.4 % (ref 39.0–52.0)
Hemoglobin: 13.8 g/dL (ref 13.0–17.0)
MCH: 30.2 pg (ref 26.0–34.0)
MCHC: 32.5 g/dL (ref 30.0–36.0)
MCV: 92.8 fL (ref 80.0–100.0)
Platelets: 311 10*3/uL (ref 150–400)
RBC: 4.57 MIL/uL (ref 4.22–5.81)
RDW: 12.4 % (ref 11.5–15.5)
WBC: 7.8 10*3/uL (ref 4.0–10.5)
nRBC: 0 % (ref 0.0–0.2)

## 2019-08-11 MED ORDER — FENOFIBRATE 145 MG PO TABS: 145.0000 mg | ORAL_TABLET | Freq: Every evening | ORAL | 1 refills | Status: AC

## 2019-08-11 MED ORDER — ATORVASTATIN CALCIUM 80 MG PO TABS: 80.0000 mg | ORAL_TABLET | Freq: Every evening | ORAL | 1 refills | Status: AC

## 2019-08-11 MED ORDER — LISINOPRIL 2.5 MG PO TABS: 2.5000 mg | ORAL_TABLET | Freq: Every day | ORAL | 3 refills | Status: DC

## 2019-08-11 MED ORDER — CARVEDILOL 6.25 MG PO TABS: 6.2500 mg | ORAL_TABLET | Freq: Two times a day (BID) | ORAL | 1 refills | Status: AC

## 2019-08-11 MED ORDER — TICAGRELOR 60 MG PO TABS: 60.0000 mg | ORAL_TABLET | Freq: Two times a day (BID) | ORAL | 1 refills | Status: AC

## 2019-08-11 MED ORDER — LISINOPRIL 10 MG PO TABS: 10.0000 mg | ORAL_TABLET | Freq: Every day | ORAL | 1 refills | Status: DC

## 2019-08-11 NOTE — Patient Instructions (Signed)
Medication Instructions:  Your physician recommends that you continue on your current medications as directed. Please refer to the Current Medication list given to you today.  Dr.Ross awaits your lab work to determine if she increases your Lisinopril  *If you need a refill on your cardiac medications before your next appointment, please call your pharmacy*  Lab Work: Cbc,bmet, lft's, lipid profile If you have labs (blood work) drawn today and your tests are completely normal, you will receive your results only by: Marland Kitchen MyChart Message (if you have MyChart) OR . A paper copy in the mail If you have any lab test that is abnormal or we need to change your treatment, we will call you to review the results.  Testing/Procedures: None   Follow-Up: As needed with Dr.Ross

## 2019-08-11 NOTE — Progress Notes (Signed)
Cardiology Office Note   Date:  08/11/2019   ID:  George Moreno, DOB 1970/10/09, MRN [id number]  PCP:  Wilfrid Lund, PA  Cardiologist:   Dietrich Pates, MD   F/U of CAD    History of Present Illness: George Moreno is a 49 y.o. male with a history of diastolic CHF, CAD, HTN and HL   Cath in 2018 after NSTEMI showed 3 V CAD  Pt declined CABG and underwent PCI with DES to mid RCA, distal RCA and Prox LAD    I last saw the pt as a televisit in June 2020  He was doing good at the time  I backed down on liisinopril to 2.5 because K was borderline high     Since seen he has increased lisinopril back up to 5 mg because his BP has been running higher  The pt denies CP  Breathing is OK   He has not been too active  Had knee surgery   Plus, he has been out of work for about 1 year   Now living in [facility]   Wants to see cardiologist closer home         Current Meds  Medication Sig  . aspirin EC 81 MG tablet Take 81 mg by mouth daily.  Marland Kitchen atorvastatin (LIPITOR) 80 MG tablet TAKE 1 TABLET EVERY EVENING  . BRILINTA 60 MG TABS tablet TAKE 1 TABLET TWICE A DAY  . carvedilol (COREG) 6.25 MG tablet TAKE 1 TABLET TWICE A DAY WITH MEALS  . fenofibrate (TRICOR) 145 MG tablet Take 145 mg by mouth every evening.  . Insulin Glargine (BASAGLAR KWIKPEN) 100 UNIT/ML SOPN Inject 22 Units into the skin daily.   Marland Kitchen lisinopril (ZESTRIL) 5 MG tablet Take 5 mg by mouth daily.  . metFORMIN (GLUCOPHAGE) 1000 MG tablet Take 1,000 mg by mouth 2 (two) times daily with a meal.  . Multiple Vitamins-Minerals (MULTIVITAMIN PO) Take 1 tablet by mouth daily.  . nitroGLYCERIN (NITROSTAT) 0.4 MG SL tablet Place 1 tablet (0.4 mg total) under the tongue every 5 (five) minutes x 3 doses as needed for chest pain.  . [DISCONTINUED] lisinopril (ZESTRIL) 2.5 MG tablet Take 1 tablet (2.5 mg total) by mouth daily.     Allergies:   Patient has no known allergies.   Past Medical History:  Diagnosis Date  . Acute  congestive heart failure (HCC)   . Chest pain 03/20/2017  . Diabetes mellitus without complication (HCC)   . Essential hypertension   . Hyperlipidemia   . Hypertension   . Morbid obesity (HCC)   . NSTEMI (non-ST elevated myocardial infarction) (HCC)   . Type II diabetes mellitus (HCC)     Past Surgical History:  Procedure Laterality Date  . CORONARY STENT INTERVENTION N/A 03/23/2017   Procedure: CORONARY STENT INTERVENTION;  Surgeon: Swaziland, Peter M, MD;  Location: [facility] INVASIVE CV LAB;  Service: Cardiovascular;  Laterality: N/A;  . KNEE ARTHROPLASTY  07/11/2019  . LEFT HEART CATH AND CORONARY ANGIOGRAPHY N/A 03/22/2017   Procedure: LEFT HEART CATH AND CORONARY ANGIOGRAPHY;  Surgeon: Runell Gess, MD;  Location: [facility] INVASIVE CV LAB;  Service: Cardiovascular;  Laterality: N/A;     Social History:  The patient  reports that he has never smoked. He has never used smokeless tobacco. He reports current alcohol use. He reports that he does not use drugs.   Family History:  The patient's family history is not on file.    ROS:  Please see  the history of present illness. All other systems are reviewed and  Negative to the above problem except as noted.    PHYSICAL EXAM: VS:  BP (!) 164/87   Pulse 82   Temp 98.7 F (37.1 C)   Ht 5\' 8"  (1.727 m)   SpO2 98%   BMI 37.25 kg/m   GEN: Morbidly obese 49 yo , in no acute distress  HEENT: normal  Neck: no JVD, carotid bruits Cardiac: RRR; no murmurs, rubs, or gallops,no edema  Respiratory:  clear to auscultation bilaterally, normal work of breathing GI: soft, nontender, nondistended, + BS  No hepatomegaly  MS: no deformity Moving all extremities   Skin: warm and dry, no rash Neuro:  Strength and sensation are intact Psych: euthymic mood, full affect   EKG:  EKG is not ordered today.   Lipid Panel    Component Value Date/Time   CHOL 88 (L) 07/08/2017 0827   TRIG 81 07/08/2017 0827   HDL 22 (L) 07/08/2017 0827   CHOLHDL 4.0  07/08/2017 0827   CHOLHDL 4.1 03/21/2017 0000   VLDL 25 03/21/2017 0000   LDLCALC 50 07/08/2017 0827     TESTS  ECHO: 03/21/2017 - Left ventricle: LVEF 60 to 65% with hypokinesis of the inferior base. Study used Definity. The cavity size was normal. There was mild concentric hypertrophy. Systolic function was normal. The estimated ejection fraction was in the range of 60% to 65%. The study is not technically sufficient to allow evaluation of LV diastolic function.  CARDIAC CATH: 03/22/2017  The left ventricular systolic function is normal.  LV end diastolic pressure is normal.  The left ventricular ejection fraction is 50-55% by visual estimate.  Ost RCA to Dist RCA lesion, 40 %stenosed.  Mid RCA lesion, 99 %stenosed.  Ost Cx lesion, 50 %stenosed.  Ost 2nd Mrg to 2nd Mrg lesion, 75 %stenosed.  Ost LAD to Prox LAD lesion, 70 %stenosed.  CORONARY STENT INTERVENTION 03/23/2017  SYNERGY DES 2.75X28 drug eluting stent was successfully placed   Mid RCA lesion, 99 %stenosed.  Post intervention, there is a 0% residual stenosis.  A STENT SYNERGY DES 2.75X20 drug eluting stent was successfully placed, and does not overlap previously placed stent.  Dist RCA lesion, 70 %stenosed.  Post intervention, there is a 0% residual stenosis.  A STENT PROMUS PREM MR 3.5X24 drug eluting stent was successfully placed.  Prox LAD lesion, 75 %stenosed.  Post intervention, there is a 0% residual stenosis.  Ost Cx lesion, 50 %stenosed.  Ost 2nd Mrg to 2nd Mrg lesion, 70 %stenosed. 1. Successful stenting of the Mid RCA with DES 2. Successful stenting of the crux of the RCA with DES 3. Successful stenting of the proximal LAD with DES Plan: DAPT for at least one year. Recommend treating residual disease in the LCx medically. Aggressive risk factor modification. Anticipate DC in am  Wt Readings from Last 3 Encounters:  12/26/18 245 lb (111.1 kg)  03/18/18 239 lb 1.9 oz  (108.5 kg)  07/08/17 245 lb (111.1 kg)      ASSESSMENT AND PLAN:  1  CAD   Pt with NSTEMI in 2018 and several interventions  Doing well on current regimen   Would get CBC   Keep on current meds including Brillinta 60 bid  2  HTN   BP is up    WIll get labs   He is now on 5 lisinopril  His K had been high in past   Will check BMET  COnsider changing to lisinopril/ HCTZ   Continue other meds   3  HL   Will get lipids     4  Morbid obesity  Encouraged him to increase activity, cut back on portions  5 COVID  PT is not going out much   Taking precautions  Pt would like to tx care to [facility] where he lives   Once he gets an appt will get records sent   Also in [facility]  Current medicines are reviewed at length with the patient today.  The patient does not have concerns regarding medicines.  Signed, Dietrich Pates, MD  08/11/2019 10:28 AM    [facility] [address] [place], [place], [place]  [address] Phone: [phone]; Fax: [phone]

## 2019-08-14 ENCOUNTER — Telehealth: Payer: Self-pay

## 2019-08-14 DIAGNOSIS — Z79899 Other long term (current) drug therapy: Secondary | ICD-10-CM

## 2019-08-14 NOTE — Telephone Encounter (Signed)
Patient states you were going to increase his Lisinopril if his labs were ok.    Please advise

## 2019-08-14 NOTE — Telephone Encounter (Signed)
-----   Message from Pricilla Riffle, MD sent at 08/13/2019 10:05 PM EST ----- CBC is normal Lpids are excllent Electrolytes and kidney function are OK except glucose is high.Watch carbs

## 2019-08-16 NOTE — Telephone Encounter (Signed)
I would recomm stopping 5 mg lisinoprl Put as combo lisinopril 10/ HCTZ 12.5      F/U BMET in 2 wks

## 2019-08-18 MED ORDER — LISINOPRIL-HYDROCHLOROTHIAZIDE 10-12.5 MG PO TABS: 1.0000 | ORAL_TABLET | Freq: Every day | ORAL | 3 refills | Status: AC

## 2019-08-18 NOTE — Telephone Encounter (Signed)
Patient aware of med change to Zestoretic, I will mail labs slip

## 2019-09-03 ENCOUNTER — Ambulatory Visit: Payer: BC Managed Care – PPO | Attending: Internal Medicine

## 2019-09-03 DIAGNOSIS — Z23 Encounter for immunization: Secondary | ICD-10-CM

## 2019-09-03 NOTE — Progress Notes (Signed)
   Covid-19 Vaccination Clinic  Name:  Chukwuemeka Artola    MRN: [id number] DOB: [birth year]  09/03/2019  Mr. Nippert was observed post Covid-19 immunization for 15 minutes without incident. He was provided with Vaccine Information Sheet and instruction to access the V-Safe system.   Mr. Kellogg was instructed to call 911 with any severe reactions post vaccine: Marland Kitchen Difficulty breathing  . Swelling of face and throat  . A fast heartbeat  . A bad rash all over body  . Dizziness and weakness   Immunizations Administered    Name Date Dose VIS Date Route   Pfizer COVID-19 Vaccine 09/03/2019  2:52 PM 0.3 mL 06/09/2019 Intramuscular   Manufacturer: [employer], [employer]   Lot: [id number]   NDC: [address]-6

## 2019-09-12 DIAGNOSIS — E113513 Type 2 diabetes mellitus with proliferative diabetic retinopathy with macular edema, bilateral: Secondary | ICD-10-CM | POA: Diagnosis not present

## 2019-09-12 DIAGNOSIS — E113512 Type 2 diabetes mellitus with proliferative diabetic retinopathy with macular edema, left eye: Secondary | ICD-10-CM | POA: Diagnosis not present

## 2019-09-12 DIAGNOSIS — H3582 Retinal ischemia: Secondary | ICD-10-CM | POA: Diagnosis not present

## 2019-10-03 ENCOUNTER — Ambulatory Visit: Payer: BC Managed Care – PPO | Attending: Internal Medicine

## 2019-10-03 DIAGNOSIS — Z23 Encounter for immunization: Secondary | ICD-10-CM

## 2019-10-03 NOTE — Progress Notes (Signed)
   Covid-19 Vaccination Clinic  Name:  Aaronmichael Brumbaugh    MRN: [id number] DOB: 20-Mar-1971  10/03/2019  Mr. Magri was observed post Covid-19 immunization for 15 minutes without incident. He was provided with Vaccine Information Sheet and instruction to access the V-Safe system.   Mr. Wiacek was instructed to call 911 with any severe reactions post vaccine: Marland Kitchen Difficulty breathing  . Swelling of face and throat  . A fast heartbeat  . A bad rash all over body  . Dizziness and weakness   Immunizations Administered    Name Date Dose VIS Date Route   Pfizer COVID-19 Vaccine 10/03/2019  1:07 PM 0.3 mL 06/09/2019 Intramuscular   Manufacturer: [employer], [employer]   Lot: [id number]   NDC: [address]-1

## 2019-10-31 DIAGNOSIS — E1142 Type 2 diabetes mellitus with diabetic polyneuropathy: Secondary | ICD-10-CM | POA: Diagnosis not present

## 2019-10-31 DIAGNOSIS — E1129 Type 2 diabetes mellitus with other diabetic kidney complication: Secondary | ICD-10-CM | POA: Diagnosis not present

## 2019-10-31 DIAGNOSIS — Z794 Long term (current) use of insulin: Secondary | ICD-10-CM | POA: Diagnosis not present

## 2019-10-31 DIAGNOSIS — E782 Mixed hyperlipidemia: Secondary | ICD-10-CM | POA: Diagnosis not present

## 2019-10-31 DIAGNOSIS — I10 Essential (primary) hypertension: Secondary | ICD-10-CM | POA: Diagnosis not present

## 2019-11-07 DIAGNOSIS — H3582 Retinal ischemia: Secondary | ICD-10-CM | POA: Diagnosis not present

## 2019-11-07 DIAGNOSIS — E113513 Type 2 diabetes mellitus with proliferative diabetic retinopathy with macular edema, bilateral: Secondary | ICD-10-CM | POA: Diagnosis not present

## 2019-11-07 DIAGNOSIS — E113512 Type 2 diabetes mellitus with proliferative diabetic retinopathy with macular edema, left eye: Secondary | ICD-10-CM | POA: Diagnosis not present

## 2020-01-02 DIAGNOSIS — E113513 Type 2 diabetes mellitus with proliferative diabetic retinopathy with macular edema, bilateral: Secondary | ICD-10-CM | POA: Diagnosis not present

## 2020-01-02 DIAGNOSIS — H35033 Hypertensive retinopathy, bilateral: Secondary | ICD-10-CM | POA: Diagnosis not present

## 2020-01-02 DIAGNOSIS — H31093 Other chorioretinal scars, bilateral: Secondary | ICD-10-CM | POA: Diagnosis not present

## 2020-01-02 DIAGNOSIS — E113512 Type 2 diabetes mellitus with proliferative diabetic retinopathy with macular edema, left eye: Secondary | ICD-10-CM | POA: Diagnosis not present

## 2020-01-15 DIAGNOSIS — H6692 Otitis media, unspecified, left ear: Secondary | ICD-10-CM | POA: Diagnosis not present

## 2020-02-01 DIAGNOSIS — E1142 Type 2 diabetes mellitus with diabetic polyneuropathy: Secondary | ICD-10-CM | POA: Diagnosis not present

## 2020-02-01 DIAGNOSIS — E1129 Type 2 diabetes mellitus with other diabetic kidney complication: Secondary | ICD-10-CM | POA: Diagnosis not present

## 2020-02-01 DIAGNOSIS — E782 Mixed hyperlipidemia: Secondary | ICD-10-CM | POA: Diagnosis not present

## 2020-02-01 DIAGNOSIS — I10 Essential (primary) hypertension: Secondary | ICD-10-CM | POA: Diagnosis not present

## 2020-02-09 DIAGNOSIS — Z87891 Personal history of nicotine dependence: Secondary | ICD-10-CM | POA: Diagnosis not present

## 2020-02-09 DIAGNOSIS — I25110 Atherosclerotic heart disease of native coronary artery with unstable angina pectoris: Secondary | ICD-10-CM | POA: Diagnosis not present

## 2020-02-09 DIAGNOSIS — T82855A Stenosis of coronary artery stent, initial encounter: Secondary | ICD-10-CM | POA: Diagnosis not present

## 2020-02-09 DIAGNOSIS — I5030 Unspecified diastolic (congestive) heart failure: Secondary | ICD-10-CM | POA: Diagnosis not present

## 2020-02-09 DIAGNOSIS — R0600 Dyspnea, unspecified: Secondary | ICD-10-CM | POA: Diagnosis not present

## 2020-02-09 DIAGNOSIS — I110 Hypertensive heart disease with heart failure: Secondary | ICD-10-CM | POA: Diagnosis not present

## 2020-02-09 DIAGNOSIS — Z6837 Body mass index (BMI) 37.0-37.9, adult: Secondary | ICD-10-CM | POA: Diagnosis not present

## 2020-02-09 DIAGNOSIS — Z79899 Other long term (current) drug therapy: Secondary | ICD-10-CM | POA: Diagnosis not present

## 2020-02-09 DIAGNOSIS — Z7982 Long term (current) use of aspirin: Secondary | ICD-10-CM | POA: Diagnosis not present

## 2020-02-09 DIAGNOSIS — E1165 Type 2 diabetes mellitus with hyperglycemia: Secondary | ICD-10-CM | POA: Diagnosis not present

## 2020-02-09 DIAGNOSIS — R0789 Other chest pain: Secondary | ICD-10-CM | POA: Diagnosis not present

## 2020-02-09 DIAGNOSIS — I252 Old myocardial infarction: Secondary | ICD-10-CM | POA: Diagnosis not present

## 2020-02-09 DIAGNOSIS — E782 Mixed hyperlipidemia: Secondary | ICD-10-CM | POA: Diagnosis not present

## 2020-02-09 DIAGNOSIS — I10 Essential (primary) hypertension: Secondary | ICD-10-CM | POA: Diagnosis not present

## 2020-02-09 DIAGNOSIS — E875 Hyperkalemia: Secondary | ICD-10-CM | POA: Diagnosis not present

## 2020-02-09 DIAGNOSIS — E1140 Type 2 diabetes mellitus with diabetic neuropathy, unspecified: Secondary | ICD-10-CM | POA: Diagnosis not present

## 2020-02-09 DIAGNOSIS — I25119 Atherosclerotic heart disease of native coronary artery with unspecified angina pectoris: Secondary | ICD-10-CM | POA: Diagnosis not present

## 2020-02-09 DIAGNOSIS — Z955 Presence of coronary angioplasty implant and graft: Secondary | ICD-10-CM | POA: Diagnosis not present

## 2020-02-09 DIAGNOSIS — R079 Chest pain, unspecified: Secondary | ICD-10-CM | POA: Diagnosis not present

## 2020-02-09 DIAGNOSIS — Z7902 Long term (current) use of antithrombotics/antiplatelets: Secondary | ICD-10-CM | POA: Diagnosis not present

## 2020-02-10 DIAGNOSIS — Z6837 Body mass index (BMI) 37.0-37.9, adult: Secondary | ICD-10-CM | POA: Diagnosis not present

## 2020-02-10 DIAGNOSIS — T82855A Stenosis of coronary artery stent, initial encounter: Secondary | ICD-10-CM | POA: Diagnosis not present

## 2020-02-10 DIAGNOSIS — I10 Essential (primary) hypertension: Secondary | ICD-10-CM | POA: Diagnosis not present

## 2020-02-10 DIAGNOSIS — I25110 Atherosclerotic heart disease of native coronary artery with unstable angina pectoris: Secondary | ICD-10-CM | POA: Diagnosis not present

## 2020-02-10 DIAGNOSIS — Z955 Presence of coronary angioplasty implant and graft: Secondary | ICD-10-CM | POA: Diagnosis not present

## 2020-02-10 DIAGNOSIS — R079 Chest pain, unspecified: Secondary | ICD-10-CM | POA: Diagnosis not present

## 2020-02-10 DIAGNOSIS — E1140 Type 2 diabetes mellitus with diabetic neuropathy, unspecified: Secondary | ICD-10-CM | POA: Diagnosis not present

## 2020-02-10 DIAGNOSIS — Z7902 Long term (current) use of antithrombotics/antiplatelets: Secondary | ICD-10-CM | POA: Diagnosis not present

## 2020-02-10 DIAGNOSIS — E1165 Type 2 diabetes mellitus with hyperglycemia: Secondary | ICD-10-CM | POA: Diagnosis not present

## 2020-02-10 DIAGNOSIS — Z79899 Other long term (current) drug therapy: Secondary | ICD-10-CM | POA: Diagnosis not present

## 2020-02-10 DIAGNOSIS — I252 Old myocardial infarction: Secondary | ICD-10-CM | POA: Diagnosis not present

## 2020-02-10 DIAGNOSIS — E875 Hyperkalemia: Secondary | ICD-10-CM | POA: Diagnosis not present

## 2020-02-10 DIAGNOSIS — Z87891 Personal history of nicotine dependence: Secondary | ICD-10-CM | POA: Diagnosis not present

## 2020-02-10 DIAGNOSIS — Z7982 Long term (current) use of aspirin: Secondary | ICD-10-CM | POA: Diagnosis not present

## 2020-02-10 DIAGNOSIS — I25119 Atherosclerotic heart disease of native coronary artery with unspecified angina pectoris: Secondary | ICD-10-CM | POA: Diagnosis not present

## 2020-02-10 DIAGNOSIS — E782 Mixed hyperlipidemia: Secondary | ICD-10-CM | POA: Diagnosis not present

## 2020-02-10 DIAGNOSIS — I110 Hypertensive heart disease with heart failure: Secondary | ICD-10-CM | POA: Diagnosis not present

## 2020-02-10 DIAGNOSIS — R0600 Dyspnea, unspecified: Secondary | ICD-10-CM | POA: Diagnosis not present

## 2020-02-10 DIAGNOSIS — I5030 Unspecified diastolic (congestive) heart failure: Secondary | ICD-10-CM | POA: Diagnosis not present

## 2020-02-11 DIAGNOSIS — Z6837 Body mass index (BMI) 37.0-37.9, adult: Secondary | ICD-10-CM | POA: Diagnosis not present

## 2020-02-11 DIAGNOSIS — E875 Hyperkalemia: Secondary | ICD-10-CM | POA: Diagnosis not present

## 2020-02-11 DIAGNOSIS — E782 Mixed hyperlipidemia: Secondary | ICD-10-CM | POA: Diagnosis not present

## 2020-02-11 DIAGNOSIS — I25119 Atherosclerotic heart disease of native coronary artery with unspecified angina pectoris: Secondary | ICD-10-CM | POA: Diagnosis not present

## 2020-02-11 DIAGNOSIS — I517 Cardiomegaly: Secondary | ICD-10-CM | POA: Diagnosis not present

## 2020-02-11 DIAGNOSIS — E1165 Type 2 diabetes mellitus with hyperglycemia: Secondary | ICD-10-CM | POA: Diagnosis not present

## 2020-02-11 DIAGNOSIS — I110 Hypertensive heart disease with heart failure: Secondary | ICD-10-CM | POA: Diagnosis not present

## 2020-02-11 DIAGNOSIS — Z79899 Other long term (current) drug therapy: Secondary | ICD-10-CM | POA: Diagnosis not present

## 2020-02-11 DIAGNOSIS — I10 Essential (primary) hypertension: Secondary | ICD-10-CM | POA: Diagnosis not present

## 2020-02-11 DIAGNOSIS — I5189 Other ill-defined heart diseases: Secondary | ICD-10-CM | POA: Diagnosis not present

## 2020-02-11 DIAGNOSIS — Z955 Presence of coronary angioplasty implant and graft: Secondary | ICD-10-CM | POA: Diagnosis not present

## 2020-02-11 DIAGNOSIS — Z87891 Personal history of nicotine dependence: Secondary | ICD-10-CM | POA: Diagnosis not present

## 2020-02-11 DIAGNOSIS — R079 Chest pain, unspecified: Secondary | ICD-10-CM | POA: Diagnosis not present

## 2020-02-11 DIAGNOSIS — Z7982 Long term (current) use of aspirin: Secondary | ICD-10-CM | POA: Diagnosis not present

## 2020-02-11 DIAGNOSIS — T82855A Stenosis of coronary artery stent, initial encounter: Secondary | ICD-10-CM | POA: Diagnosis not present

## 2020-02-11 DIAGNOSIS — Z7902 Long term (current) use of antithrombotics/antiplatelets: Secondary | ICD-10-CM | POA: Diagnosis not present

## 2020-02-11 DIAGNOSIS — E1140 Type 2 diabetes mellitus with diabetic neuropathy, unspecified: Secondary | ICD-10-CM | POA: Diagnosis not present

## 2020-02-11 DIAGNOSIS — R0600 Dyspnea, unspecified: Secondary | ICD-10-CM | POA: Diagnosis not present

## 2020-02-11 DIAGNOSIS — I252 Old myocardial infarction: Secondary | ICD-10-CM | POA: Diagnosis not present

## 2020-02-11 DIAGNOSIS — I25110 Atherosclerotic heart disease of native coronary artery with unstable angina pectoris: Secondary | ICD-10-CM | POA: Diagnosis not present

## 2020-02-11 DIAGNOSIS — I5030 Unspecified diastolic (congestive) heart failure: Secondary | ICD-10-CM | POA: Diagnosis not present

## 2020-02-12 DIAGNOSIS — E875 Hyperkalemia: Secondary | ICD-10-CM | POA: Diagnosis not present

## 2020-02-12 DIAGNOSIS — I5030 Unspecified diastolic (congestive) heart failure: Secondary | ICD-10-CM | POA: Diagnosis not present

## 2020-02-12 DIAGNOSIS — Z955 Presence of coronary angioplasty implant and graft: Secondary | ICD-10-CM | POA: Diagnosis not present

## 2020-02-12 DIAGNOSIS — Z79899 Other long term (current) drug therapy: Secondary | ICD-10-CM | POA: Diagnosis not present

## 2020-02-12 DIAGNOSIS — Z7902 Long term (current) use of antithrombotics/antiplatelets: Secondary | ICD-10-CM | POA: Diagnosis not present

## 2020-02-12 DIAGNOSIS — Z87891 Personal history of nicotine dependence: Secondary | ICD-10-CM | POA: Diagnosis not present

## 2020-02-12 DIAGNOSIS — I25110 Atherosclerotic heart disease of native coronary artery with unstable angina pectoris: Secondary | ICD-10-CM | POA: Diagnosis not present

## 2020-02-12 DIAGNOSIS — E1165 Type 2 diabetes mellitus with hyperglycemia: Secondary | ICD-10-CM | POA: Diagnosis not present

## 2020-02-12 DIAGNOSIS — I110 Hypertensive heart disease with heart failure: Secondary | ICD-10-CM | POA: Diagnosis not present

## 2020-02-12 DIAGNOSIS — R079 Chest pain, unspecified: Secondary | ICD-10-CM | POA: Diagnosis not present

## 2020-02-12 DIAGNOSIS — T82855A Stenosis of coronary artery stent, initial encounter: Secondary | ICD-10-CM | POA: Diagnosis not present

## 2020-02-12 DIAGNOSIS — Z7982 Long term (current) use of aspirin: Secondary | ICD-10-CM | POA: Diagnosis not present

## 2020-02-12 DIAGNOSIS — E1140 Type 2 diabetes mellitus with diabetic neuropathy, unspecified: Secondary | ICD-10-CM | POA: Diagnosis not present

## 2020-02-12 DIAGNOSIS — E782 Mixed hyperlipidemia: Secondary | ICD-10-CM | POA: Diagnosis not present

## 2020-02-12 DIAGNOSIS — I252 Old myocardial infarction: Secondary | ICD-10-CM | POA: Diagnosis not present

## 2020-02-12 DIAGNOSIS — I253 Aneurysm of heart: Secondary | ICD-10-CM | POA: Diagnosis not present

## 2020-02-12 DIAGNOSIS — Z6837 Body mass index (BMI) 37.0-37.9, adult: Secondary | ICD-10-CM | POA: Diagnosis not present

## 2020-02-13 DIAGNOSIS — T82855A Stenosis of coronary artery stent, initial encounter: Secondary | ICD-10-CM | POA: Diagnosis not present

## 2020-02-13 DIAGNOSIS — E782 Mixed hyperlipidemia: Secondary | ICD-10-CM | POA: Diagnosis not present

## 2020-02-13 DIAGNOSIS — I110 Hypertensive heart disease with heart failure: Secondary | ICD-10-CM | POA: Diagnosis not present

## 2020-02-13 DIAGNOSIS — I200 Unstable angina: Secondary | ICD-10-CM | POA: Diagnosis not present

## 2020-02-13 DIAGNOSIS — I5030 Unspecified diastolic (congestive) heart failure: Secondary | ICD-10-CM | POA: Diagnosis not present

## 2020-02-13 DIAGNOSIS — I252 Old myocardial infarction: Secondary | ICD-10-CM | POA: Diagnosis not present

## 2020-02-13 DIAGNOSIS — Z6837 Body mass index (BMI) 37.0-37.9, adult: Secondary | ICD-10-CM | POA: Diagnosis not present

## 2020-02-13 DIAGNOSIS — Z7982 Long term (current) use of aspirin: Secondary | ICD-10-CM | POA: Diagnosis not present

## 2020-02-13 DIAGNOSIS — Z955 Presence of coronary angioplasty implant and graft: Secondary | ICD-10-CM | POA: Diagnosis not present

## 2020-02-13 DIAGNOSIS — Z87891 Personal history of nicotine dependence: Secondary | ICD-10-CM | POA: Diagnosis not present

## 2020-02-13 DIAGNOSIS — E1165 Type 2 diabetes mellitus with hyperglycemia: Secondary | ICD-10-CM | POA: Diagnosis not present

## 2020-02-13 DIAGNOSIS — Z79899 Other long term (current) drug therapy: Secondary | ICD-10-CM | POA: Diagnosis not present

## 2020-02-13 DIAGNOSIS — I25110 Atherosclerotic heart disease of native coronary artery with unstable angina pectoris: Secondary | ICD-10-CM | POA: Diagnosis not present

## 2020-02-13 DIAGNOSIS — R079 Chest pain, unspecified: Secondary | ICD-10-CM | POA: Diagnosis not present

## 2020-02-13 DIAGNOSIS — E875 Hyperkalemia: Secondary | ICD-10-CM | POA: Diagnosis not present

## 2020-02-13 DIAGNOSIS — Z7902 Long term (current) use of antithrombotics/antiplatelets: Secondary | ICD-10-CM | POA: Diagnosis not present

## 2020-02-13 DIAGNOSIS — E1140 Type 2 diabetes mellitus with diabetic neuropathy, unspecified: Secondary | ICD-10-CM | POA: Diagnosis not present

## 2020-02-19 DIAGNOSIS — R809 Proteinuria, unspecified: Secondary | ICD-10-CM | POA: Diagnosis not present

## 2020-02-19 DIAGNOSIS — Z794 Long term (current) use of insulin: Secondary | ICD-10-CM | POA: Diagnosis not present

## 2020-02-19 DIAGNOSIS — E1122 Type 2 diabetes mellitus with diabetic chronic kidney disease: Secondary | ICD-10-CM | POA: Diagnosis not present

## 2020-02-19 DIAGNOSIS — E785 Hyperlipidemia, unspecified: Secondary | ICD-10-CM | POA: Diagnosis not present

## 2020-02-19 DIAGNOSIS — I2510 Atherosclerotic heart disease of native coronary artery without angina pectoris: Secondary | ICD-10-CM | POA: Diagnosis not present

## 2020-02-19 DIAGNOSIS — R801 Persistent proteinuria, unspecified: Secondary | ICD-10-CM | POA: Diagnosis not present

## 2020-02-19 DIAGNOSIS — E875 Hyperkalemia: Secondary | ICD-10-CM | POA: Diagnosis not present

## 2020-02-19 DIAGNOSIS — N182 Chronic kidney disease, stage 2 (mild): Secondary | ICD-10-CM | POA: Diagnosis not present

## 2020-02-19 DIAGNOSIS — D631 Anemia in chronic kidney disease: Secondary | ICD-10-CM | POA: Diagnosis not present

## 2020-02-19 DIAGNOSIS — E1165 Type 2 diabetes mellitus with hyperglycemia: Secondary | ICD-10-CM | POA: Diagnosis not present

## 2020-02-19 DIAGNOSIS — Z955 Presence of coronary angioplasty implant and graft: Secondary | ICD-10-CM | POA: Diagnosis not present

## 2020-02-19 DIAGNOSIS — Z87891 Personal history of nicotine dependence: Secondary | ICD-10-CM | POA: Diagnosis not present

## 2020-02-19 DIAGNOSIS — I129 Hypertensive chronic kidney disease with stage 1 through stage 4 chronic kidney disease, or unspecified chronic kidney disease: Secondary | ICD-10-CM | POA: Diagnosis not present

## 2020-02-19 DIAGNOSIS — Z79899 Other long term (current) drug therapy: Secondary | ICD-10-CM | POA: Diagnosis not present

## 2020-02-21 DIAGNOSIS — I25119 Atherosclerotic heart disease of native coronary artery with unspecified angina pectoris: Secondary | ICD-10-CM | POA: Diagnosis not present

## 2020-02-21 IMAGING — US US ABDOMEN COMPLETE
1 series · 14 of 25 positions shown · non-contrast
Comparison: None.

CLINICAL DATA: Elevated liver enzymes

EXAM:
ABDOMEN ULTRASOUND COMPLETE

[Series 1: us abdomen complete · 0.17mm/px · 14 of 92 slices shown]
[im 1/92]
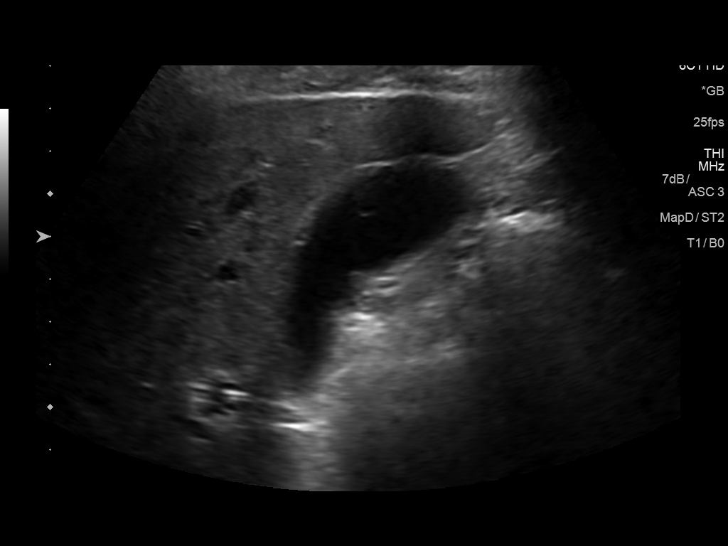
[im 8/92]
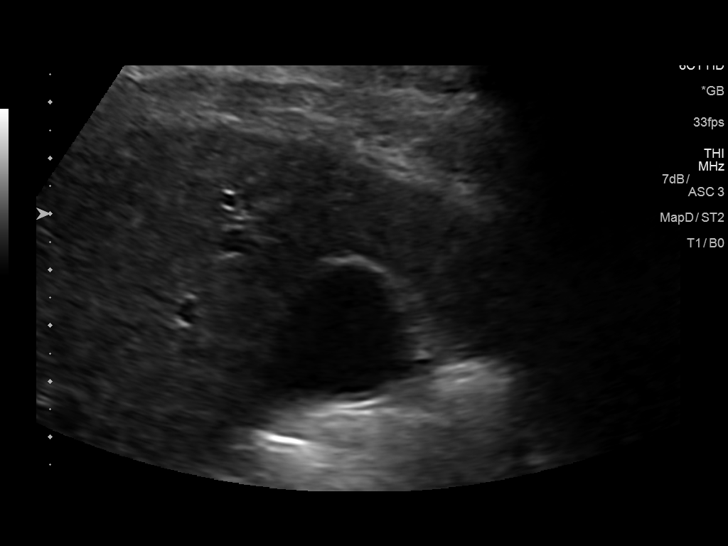
[im 16/92]
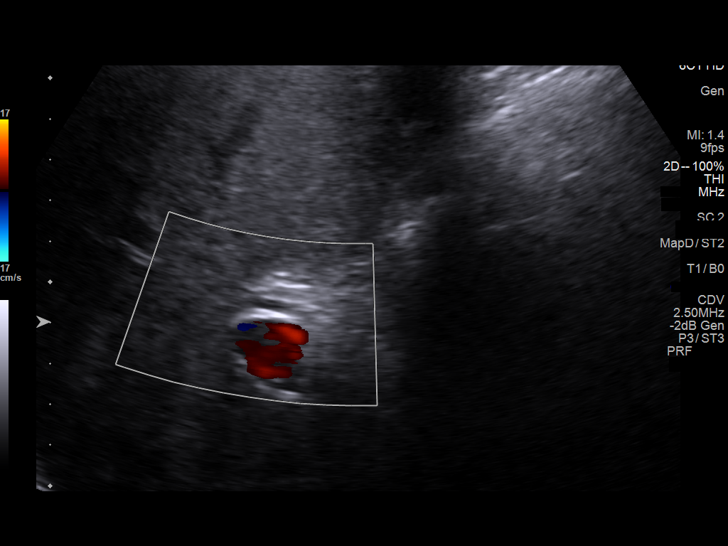
[im 23/92]
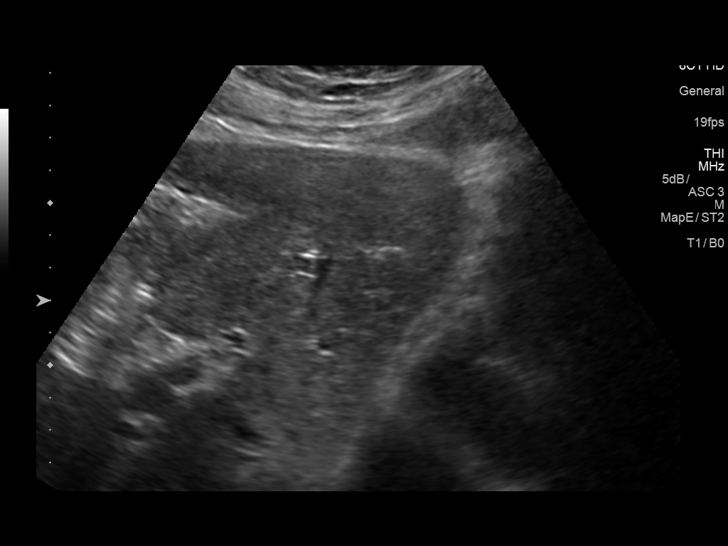
[im 31/92]
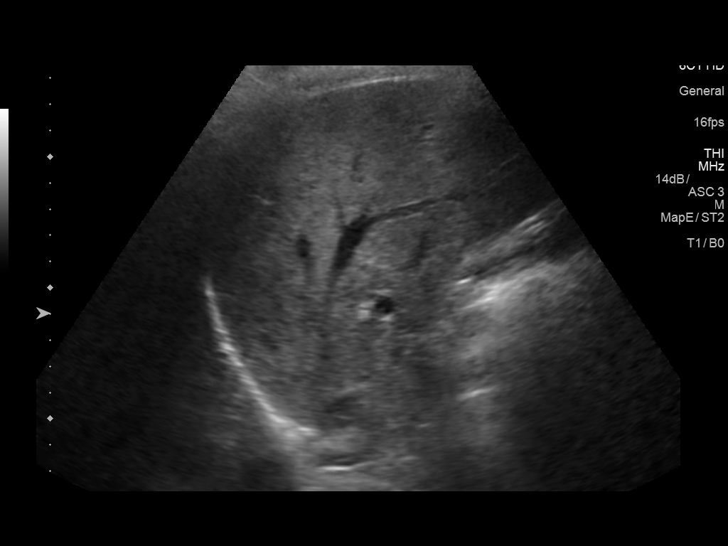
[im 35/92]
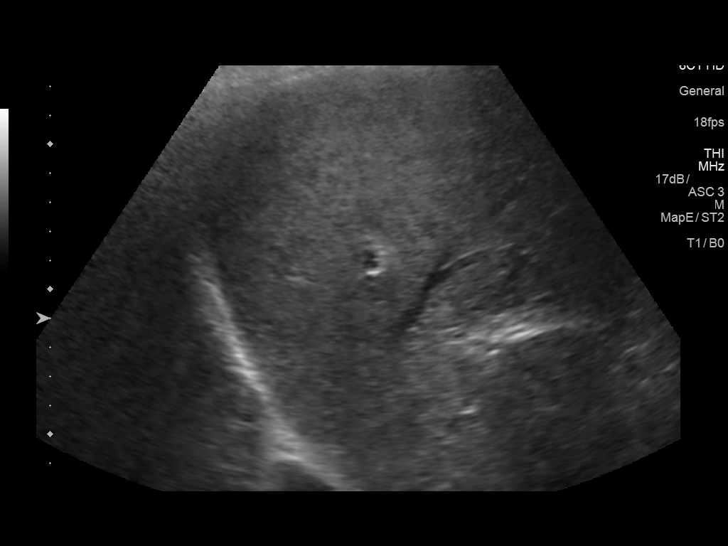
[im 42/92]
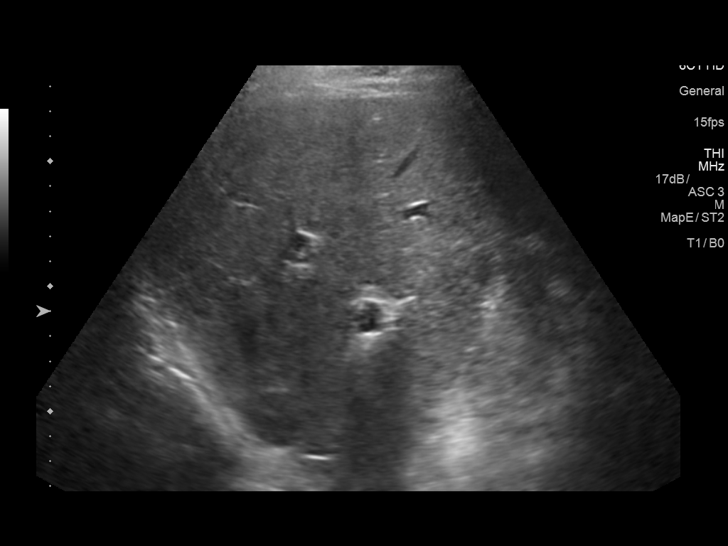
[im 50/92]
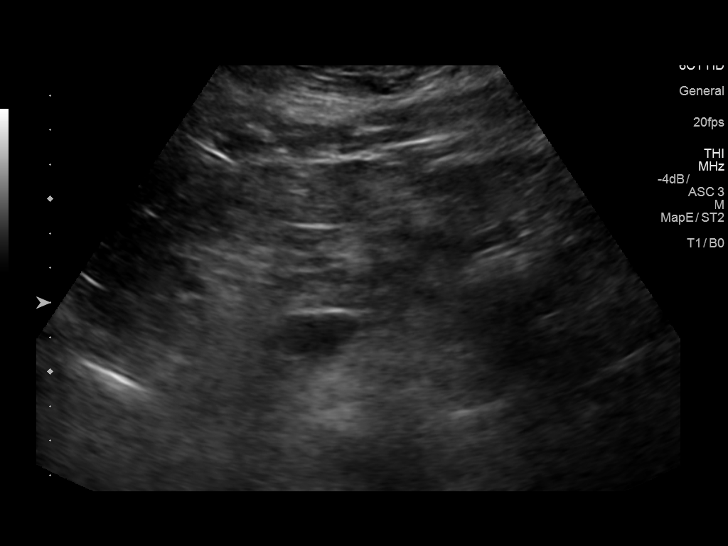
[im 57/92]
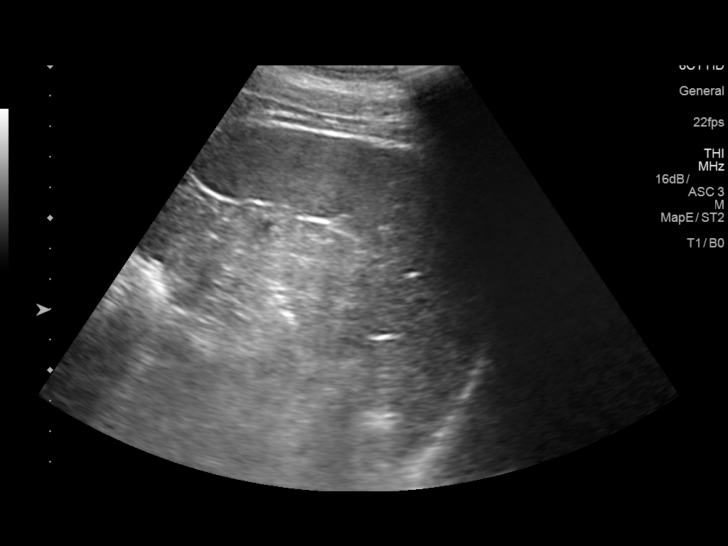
[im 61/92]
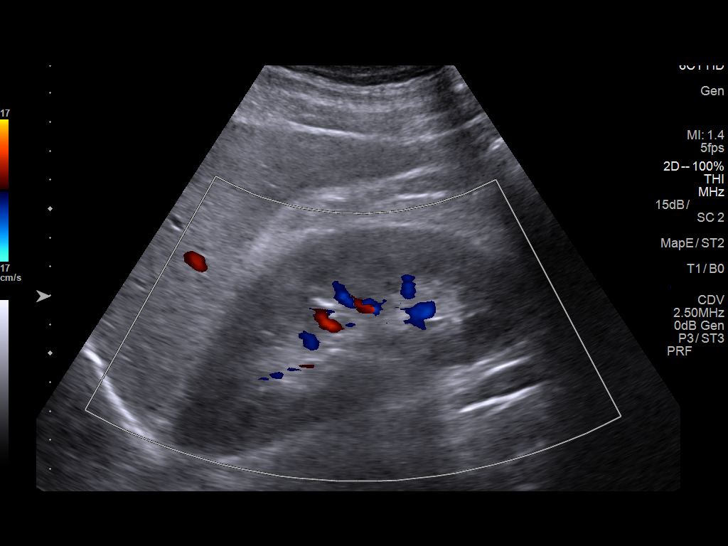
[im 69/92]
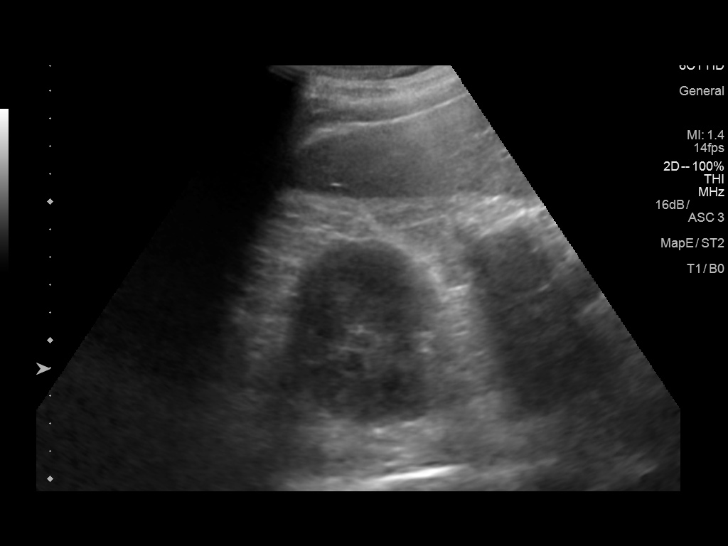
[im 76/92]
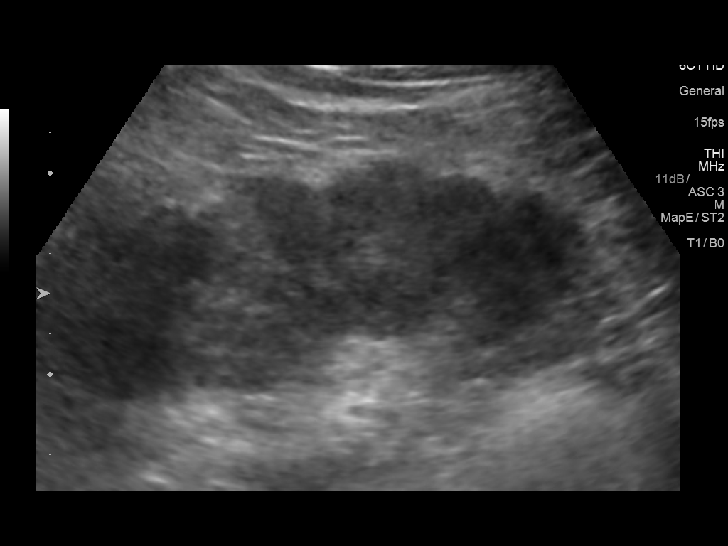
[im 84/92]
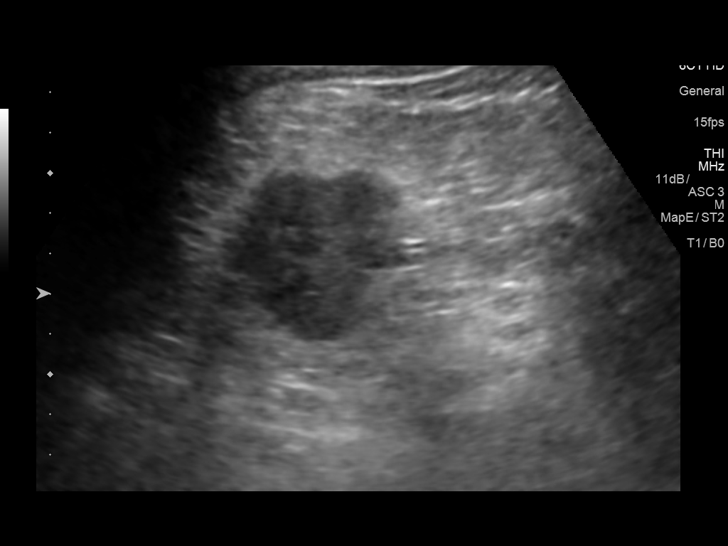
[im 92/92]
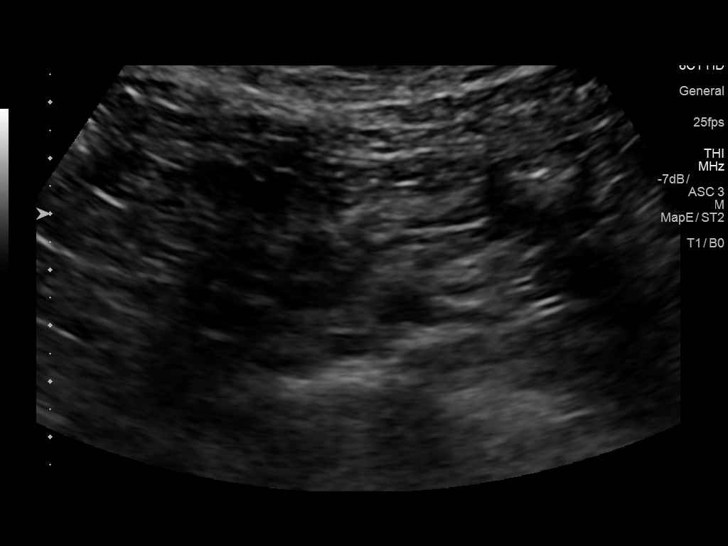

[14 of 25 positions shown; findings below may reference images not displayed]

FINDINGS: Gallbladder: No gallstones or wall thickening visualized. There is
no pericholecystic fluid. No sonographic Murphy sign noted by
sonographer.

Common bile duct: Diameter: 3 mm. No intrahepatic, common hepatic,
or common bile duct dilatation.

Liver: No focal lesion identified. Within normal limits in
parenchymal echogenicity. Portal vein is patent on color Doppler
imaging with normal direction of blood flow towards the liver.

IVC: No abnormality visualized.

Pancreas: No pancreatic mass or inflammatory focus.

Spleen: Size and appearance within normal limits.

Right Kidney: Length: 14.2 cm. Echogenicity within normal limits. No
mass or hydronephrosis visualized.

Left Kidney: Length: 13.3 cm. Echogenicity within normal limits. No
mass or hydronephrosis visualized.

Abdominal aorta: No aneurysm visualized.

Other findings: No demonstrable ascites.
IMPRESSION: Study within normal limits.

## 2020-02-28 DIAGNOSIS — Z01810 Encounter for preprocedural cardiovascular examination: Secondary | ICD-10-CM | POA: Diagnosis not present

## 2020-02-28 DIAGNOSIS — Z01818 Encounter for other preprocedural examination: Secondary | ICD-10-CM | POA: Diagnosis not present

## 2020-02-28 DIAGNOSIS — Z01812 Encounter for preprocedural laboratory examination: Secondary | ICD-10-CM | POA: Diagnosis not present

## 2020-02-28 DIAGNOSIS — I252 Old myocardial infarction: Secondary | ICD-10-CM | POA: Diagnosis not present

## 2020-02-28 DIAGNOSIS — R0789 Other chest pain: Secondary | ICD-10-CM | POA: Diagnosis not present

## 2020-02-28 DIAGNOSIS — Z87891 Personal history of nicotine dependence: Secondary | ICD-10-CM | POA: Diagnosis not present

## 2020-02-28 DIAGNOSIS — I25119 Atherosclerotic heart disease of native coronary artery with unspecified angina pectoris: Secondary | ICD-10-CM | POA: Diagnosis not present

## 2020-02-28 DIAGNOSIS — Z01811 Encounter for preprocedural respiratory examination: Secondary | ICD-10-CM | POA: Diagnosis not present

## 2020-02-29 DIAGNOSIS — E1165 Type 2 diabetes mellitus with hyperglycemia: Secondary | ICD-10-CM | POA: Diagnosis not present

## 2020-02-29 DIAGNOSIS — Z48812 Encounter for surgical aftercare following surgery on the circulatory system: Secondary | ICD-10-CM | POA: Diagnosis not present

## 2020-02-29 DIAGNOSIS — I081 Rheumatic disorders of both mitral and tricuspid valves: Secondary | ICD-10-CM | POA: Diagnosis not present

## 2020-02-29 DIAGNOSIS — Z794 Long term (current) use of insulin: Secondary | ICD-10-CM | POA: Diagnosis not present

## 2020-02-29 DIAGNOSIS — Z7902 Long term (current) use of antithrombotics/antiplatelets: Secondary | ICD-10-CM | POA: Diagnosis not present

## 2020-02-29 DIAGNOSIS — E782 Mixed hyperlipidemia: Secondary | ICD-10-CM | POA: Diagnosis not present

## 2020-02-29 DIAGNOSIS — Z6836 Body mass index (BMI) 36.0-36.9, adult: Secondary | ICD-10-CM | POA: Diagnosis not present

## 2020-02-29 DIAGNOSIS — Z951 Presence of aortocoronary bypass graft: Secondary | ICD-10-CM | POA: Diagnosis not present

## 2020-02-29 DIAGNOSIS — Z7982 Long term (current) use of aspirin: Secondary | ICD-10-CM | POA: Diagnosis not present

## 2020-02-29 DIAGNOSIS — Z4682 Encounter for fitting and adjustment of non-vascular catheter: Secondary | ICD-10-CM | POA: Diagnosis not present

## 2020-02-29 DIAGNOSIS — Z79899 Other long term (current) drug therapy: Secondary | ICD-10-CM | POA: Diagnosis not present

## 2020-02-29 DIAGNOSIS — Z87891 Personal history of nicotine dependence: Secondary | ICD-10-CM | POA: Diagnosis not present

## 2020-02-29 DIAGNOSIS — I10 Essential (primary) hypertension: Secondary | ICD-10-CM | POA: Diagnosis not present

## 2020-02-29 DIAGNOSIS — R079 Chest pain, unspecified: Secondary | ICD-10-CM | POA: Diagnosis not present

## 2020-02-29 DIAGNOSIS — I252 Old myocardial infarction: Secondary | ICD-10-CM | POA: Diagnosis not present

## 2020-02-29 DIAGNOSIS — I2510 Atherosclerotic heart disease of native coronary artery without angina pectoris: Secondary | ICD-10-CM | POA: Diagnosis not present

## 2020-02-29 DIAGNOSIS — Z955 Presence of coronary angioplasty implant and graft: Secondary | ICD-10-CM | POA: Diagnosis not present

## 2020-02-29 DIAGNOSIS — J90 Pleural effusion, not elsewhere classified: Secondary | ICD-10-CM | POA: Diagnosis not present

## 2020-02-29 DIAGNOSIS — Z9889 Other specified postprocedural states: Secondary | ICD-10-CM | POA: Diagnosis not present

## 2020-02-29 DIAGNOSIS — R918 Other nonspecific abnormal finding of lung field: Secondary | ICD-10-CM | POA: Diagnosis not present

## 2020-02-29 DIAGNOSIS — J301 Allergic rhinitis due to pollen: Secondary | ICD-10-CM | POA: Diagnosis not present

## 2020-02-29 DIAGNOSIS — I25110 Atherosclerotic heart disease of native coronary artery with unstable angina pectoris: Secondary | ICD-10-CM | POA: Diagnosis not present

## 2020-03-18 DIAGNOSIS — Z955 Presence of coronary angioplasty implant and graft: Secondary | ICD-10-CM | POA: Diagnosis not present

## 2020-03-18 DIAGNOSIS — I10 Essential (primary) hypertension: Secondary | ICD-10-CM | POA: Diagnosis not present

## 2020-03-18 DIAGNOSIS — Z951 Presence of aortocoronary bypass graft: Secondary | ICD-10-CM | POA: Diagnosis not present

## 2020-03-18 DIAGNOSIS — I2510 Atherosclerotic heart disease of native coronary artery without angina pectoris: Secondary | ICD-10-CM | POA: Diagnosis not present

## 2020-03-19 DIAGNOSIS — Z951 Presence of aortocoronary bypass graft: Secondary | ICD-10-CM | POA: Diagnosis not present

## 2020-04-01 DIAGNOSIS — I2510 Atherosclerotic heart disease of native coronary artery without angina pectoris: Secondary | ICD-10-CM | POA: Diagnosis not present

## 2020-04-01 DIAGNOSIS — Z955 Presence of coronary angioplasty implant and graft: Secondary | ICD-10-CM | POA: Diagnosis not present

## 2020-04-01 DIAGNOSIS — Z951 Presence of aortocoronary bypass graft: Secondary | ICD-10-CM | POA: Diagnosis not present

## 2020-04-01 DIAGNOSIS — I10 Essential (primary) hypertension: Secondary | ICD-10-CM | POA: Diagnosis not present

## 2020-04-15 DIAGNOSIS — E875 Hyperkalemia: Secondary | ICD-10-CM | POA: Diagnosis not present

## 2020-04-30 DIAGNOSIS — R809 Proteinuria, unspecified: Secondary | ICD-10-CM | POA: Diagnosis not present

## 2020-04-30 DIAGNOSIS — E1129 Type 2 diabetes mellitus with other diabetic kidney complication: Secondary | ICD-10-CM | POA: Diagnosis not present

## 2020-04-30 DIAGNOSIS — I10 Essential (primary) hypertension: Secondary | ICD-10-CM | POA: Diagnosis not present

## 2020-04-30 DIAGNOSIS — E782 Mixed hyperlipidemia: Secondary | ICD-10-CM | POA: Diagnosis not present

## 2020-04-30 DIAGNOSIS — E1142 Type 2 diabetes mellitus with diabetic polyneuropathy: Secondary | ICD-10-CM | POA: Diagnosis not present

## 2020-04-30 DIAGNOSIS — Z23 Encounter for immunization: Secondary | ICD-10-CM | POA: Diagnosis not present

## 2020-05-20 DIAGNOSIS — I2510 Atherosclerotic heart disease of native coronary artery without angina pectoris: Secondary | ICD-10-CM | POA: Diagnosis not present

## 2020-05-20 DIAGNOSIS — I10 Essential (primary) hypertension: Secondary | ICD-10-CM | POA: Diagnosis not present

## 2020-05-20 DIAGNOSIS — E782 Mixed hyperlipidemia: Secondary | ICD-10-CM | POA: Diagnosis not present

## 2020-05-20 DIAGNOSIS — Z951 Presence of aortocoronary bypass graft: Secondary | ICD-10-CM | POA: Diagnosis not present

## 2020-08-21 DIAGNOSIS — I129 Hypertensive chronic kidney disease with stage 1 through stage 4 chronic kidney disease, or unspecified chronic kidney disease: Secondary | ICD-10-CM | POA: Diagnosis not present

## 2020-08-21 DIAGNOSIS — N182 Chronic kidney disease, stage 2 (mild): Secondary | ICD-10-CM | POA: Diagnosis not present

## 2020-08-21 DIAGNOSIS — E1122 Type 2 diabetes mellitus with diabetic chronic kidney disease: Secondary | ICD-10-CM | POA: Diagnosis not present

## 2020-08-21 DIAGNOSIS — D631 Anemia in chronic kidney disease: Secondary | ICD-10-CM | POA: Diagnosis not present

## 2020-11-20 DIAGNOSIS — E782 Mixed hyperlipidemia: Secondary | ICD-10-CM | POA: Diagnosis not present

## 2020-11-20 DIAGNOSIS — I10 Essential (primary) hypertension: Secondary | ICD-10-CM | POA: Diagnosis not present

## 2020-11-20 DIAGNOSIS — Z951 Presence of aortocoronary bypass graft: Secondary | ICD-10-CM | POA: Diagnosis not present

## 2020-11-23 DIAGNOSIS — I10 Essential (primary) hypertension: Secondary | ICD-10-CM | POA: Diagnosis not present

## 2020-11-23 DIAGNOSIS — J029 Acute pharyngitis, unspecified: Secondary | ICD-10-CM | POA: Diagnosis not present

## 2020-11-23 DIAGNOSIS — J069 Acute upper respiratory infection, unspecified: Secondary | ICD-10-CM | POA: Diagnosis not present

## 2021-02-17 DIAGNOSIS — E782 Mixed hyperlipidemia: Secondary | ICD-10-CM | POA: Diagnosis not present

## 2021-02-17 DIAGNOSIS — Z951 Presence of aortocoronary bypass graft: Secondary | ICD-10-CM | POA: Diagnosis not present

## 2021-02-17 DIAGNOSIS — I10 Essential (primary) hypertension: Secondary | ICD-10-CM | POA: Diagnosis not present

## 2021-02-17 DIAGNOSIS — Z955 Presence of coronary angioplasty implant and graft: Secondary | ICD-10-CM | POA: Diagnosis not present

## 2021-02-26 DIAGNOSIS — I10 Essential (primary) hypertension: Secondary | ICD-10-CM | POA: Diagnosis not present

## 2021-02-26 DIAGNOSIS — R0981 Nasal congestion: Secondary | ICD-10-CM | POA: Diagnosis not present

## 2021-02-26 DIAGNOSIS — Z20822 Contact with and (suspected) exposure to covid-19: Secondary | ICD-10-CM | POA: Diagnosis not present

## 2021-02-26 DIAGNOSIS — R059 Cough, unspecified: Secondary | ICD-10-CM | POA: Diagnosis not present

## 2021-04-14 DIAGNOSIS — Z23 Encounter for immunization: Secondary | ICD-10-CM | POA: Diagnosis not present

## 2021-04-14 DIAGNOSIS — I2510 Atherosclerotic heart disease of native coronary artery without angina pectoris: Secondary | ICD-10-CM | POA: Diagnosis not present

## 2021-04-14 DIAGNOSIS — E119 Type 2 diabetes mellitus without complications: Secondary | ICD-10-CM | POA: Diagnosis not present

## 2021-04-14 DIAGNOSIS — Z794 Long term (current) use of insulin: Secondary | ICD-10-CM | POA: Diagnosis not present

## 2021-04-14 DIAGNOSIS — I10 Essential (primary) hypertension: Secondary | ICD-10-CM | POA: Diagnosis not present

## 2021-04-14 DIAGNOSIS — E782 Mixed hyperlipidemia: Secondary | ICD-10-CM | POA: Diagnosis not present

## 2021-04-23 DIAGNOSIS — N182 Chronic kidney disease, stage 2 (mild): Secondary | ICD-10-CM | POA: Diagnosis not present
